# Patient Record
Sex: Female | Born: 1965 | Race: White | Hispanic: No | Marital: Single | Smoking: Current every day smoker
Health system: Southern US, Community
[De-identification: ages and names within clinical notes are randomized; demographics above are authoritative.]

## PROBLEM LIST (undated history)

## (undated) DIAGNOSIS — N200 Calculus of kidney: Secondary | ICD-10-CM

## (undated) DIAGNOSIS — F419 Anxiety disorder, unspecified: Secondary | ICD-10-CM

## (undated) DIAGNOSIS — F1020 Alcohol dependence, uncomplicated: Secondary | ICD-10-CM

## (undated) HISTORY — PX: LEG SURGERY: SHX1003

## (undated) HISTORY — PX: LITHOTRIPSY: SUR834

---

## 2007-07-16 ENCOUNTER — Ambulatory Visit: Payer: Self-pay | Admitting: Nurse Practitioner

## 2007-09-23 ENCOUNTER — Emergency Department: Payer: Self-pay | Admitting: Unknown Physician Specialty

## 2013-03-04 ENCOUNTER — Encounter: Payer: Self-pay | Admitting: Rehabilitation

## 2013-03-09 ENCOUNTER — Encounter: Payer: Self-pay | Admitting: Rehabilitation

## 2013-03-23 ENCOUNTER — Ambulatory Visit: Payer: Self-pay | Admitting: Family Medicine

## 2015-02-10 IMAGING — CT CT CHEST W/ CM
1 series · 15 of 33 positions shown, 19 images · non-contrast
Comparison: none

REASON FOR EXAM: labs 1st abnormal chest XR   eval for nodule
COMMENTS:

[Series 2: soft tissue · axial · 0.68mm/px · z∈[-569,-287]mm · 15 of 112 slices shown, 19 images]
[im 9/112  mediastinal]
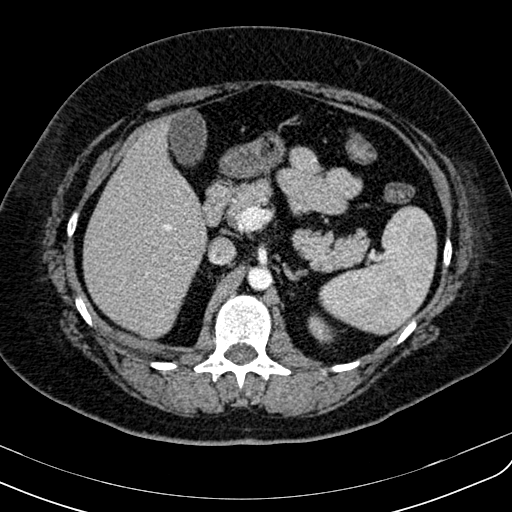
[im 9/112  lung]
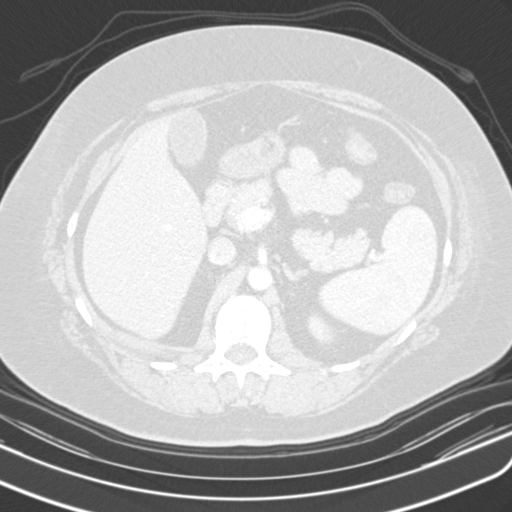
[im 17/112  lung]
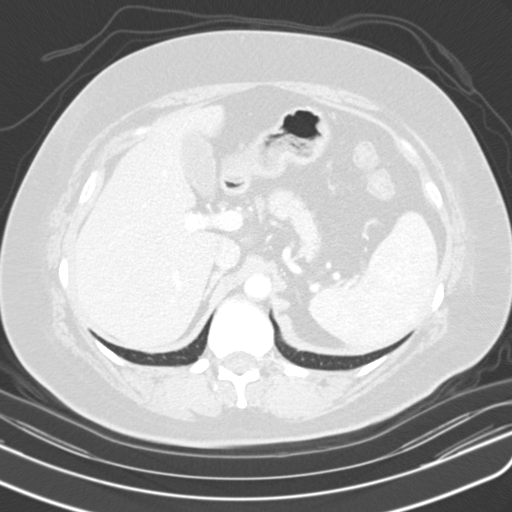
[im 23/112  lung]
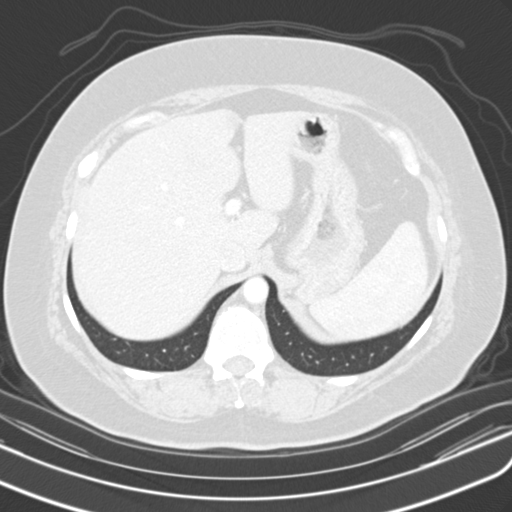
[im 29/112  lung]
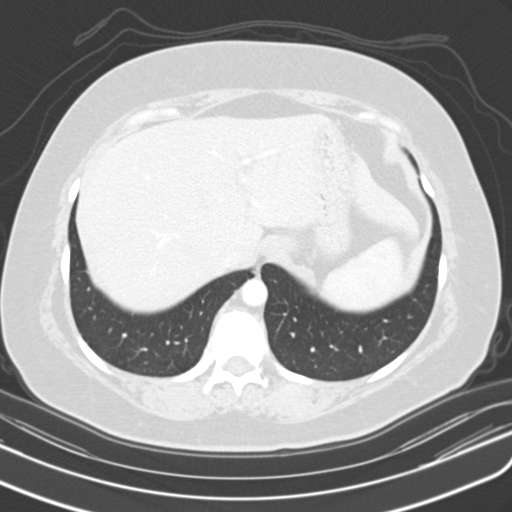
[im 38/112  mediastinal]
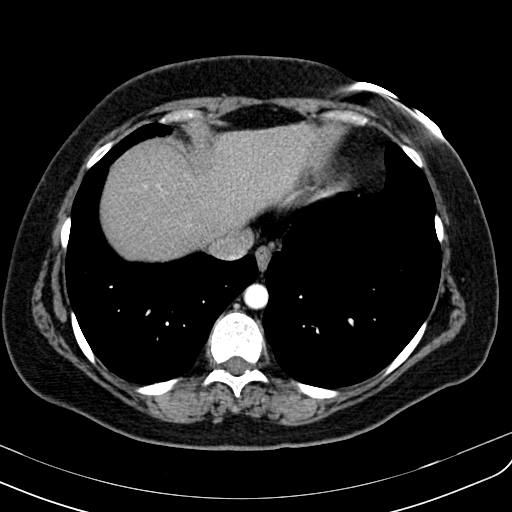
[im 38/112  lung]
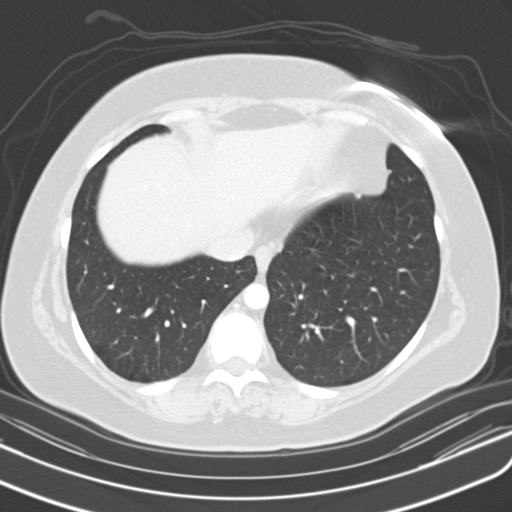
[im 45/112  lung]
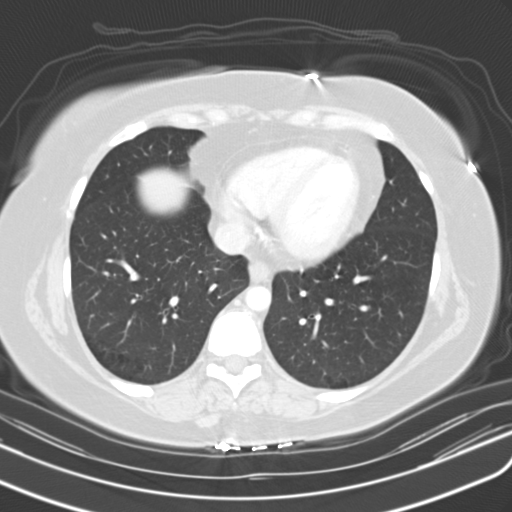
[im 50/112  lung]
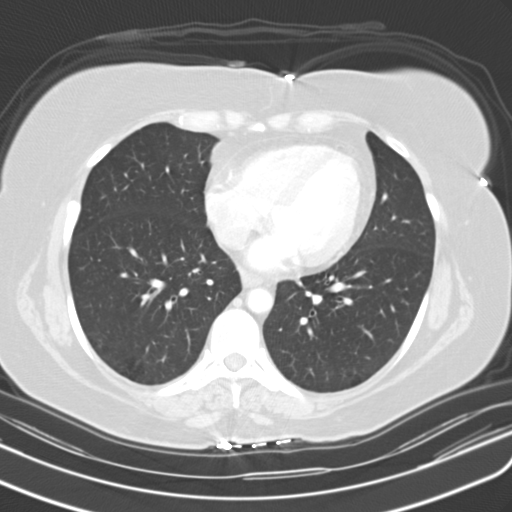
[im 58/112  lung]
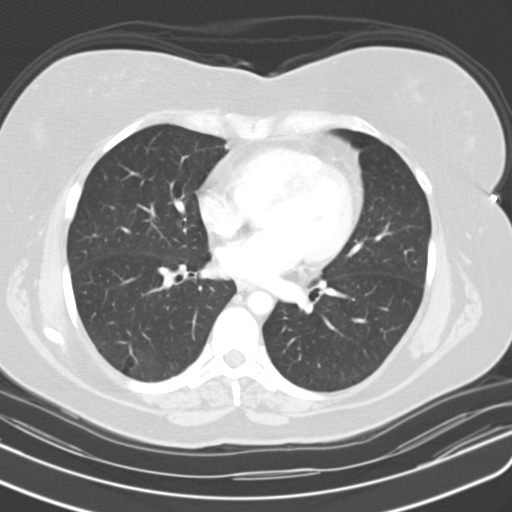
[im 62/112  mediastinal]
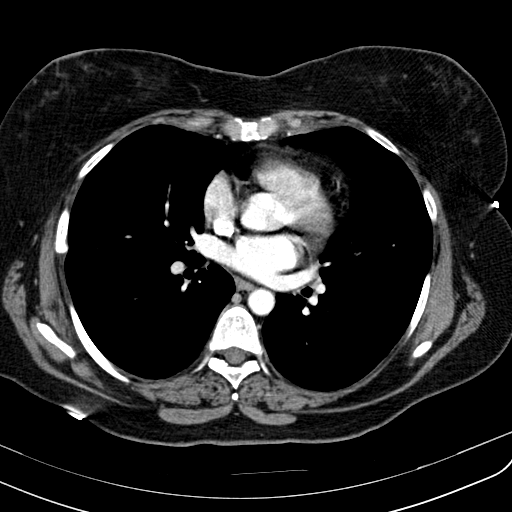
[im 62/112  lung]
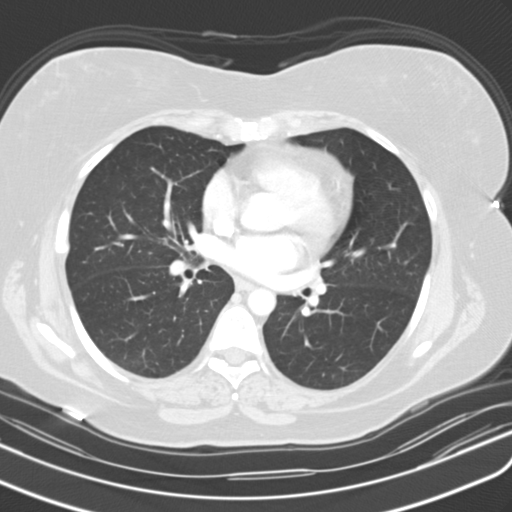
[im 67/112  lung]
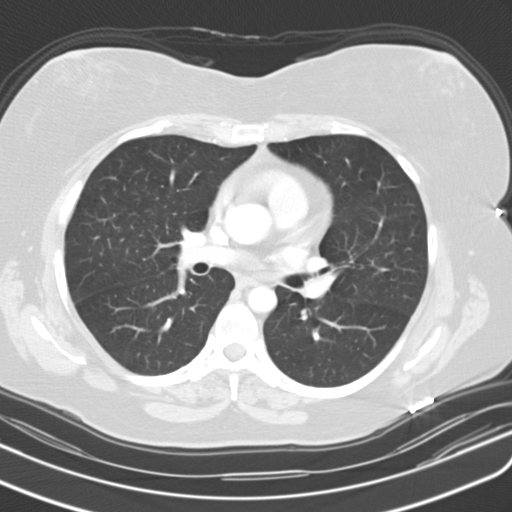
[im 75/112  lung]
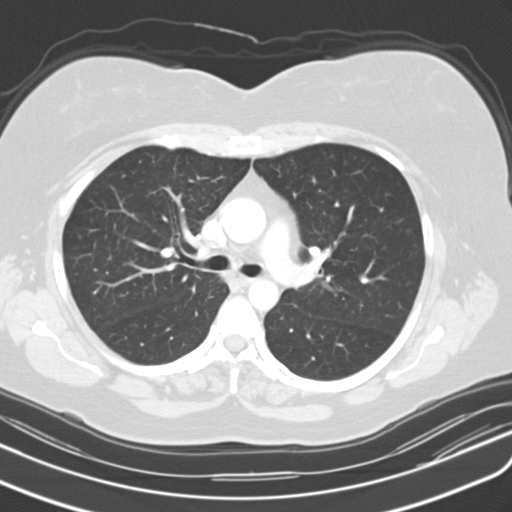
[im 83/112  lung]
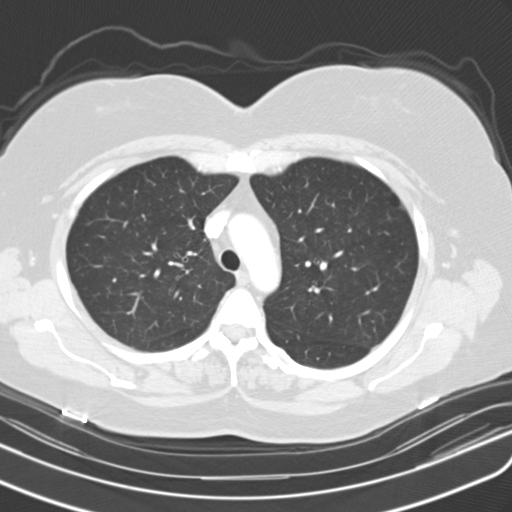
[im 89/112  mediastinal]
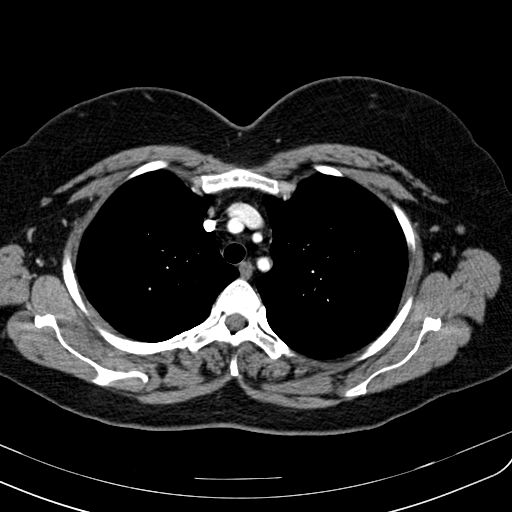
[im 89/112  lung]
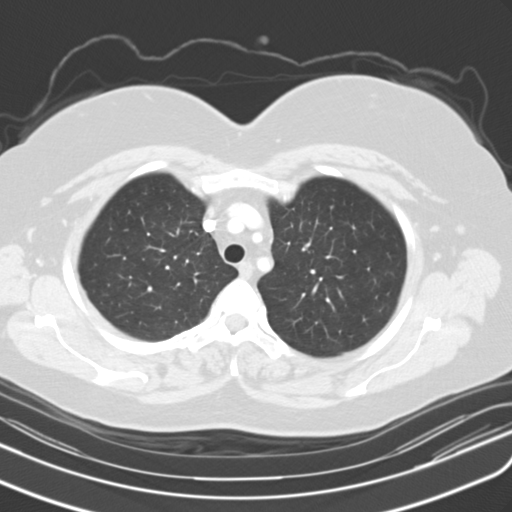
[im 95/112  lung]
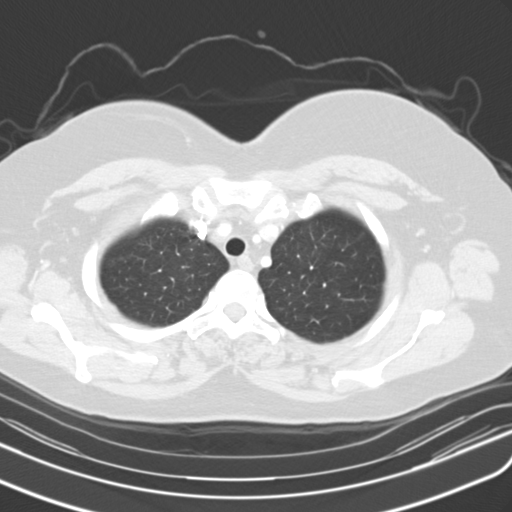
[im 103/112  lung]
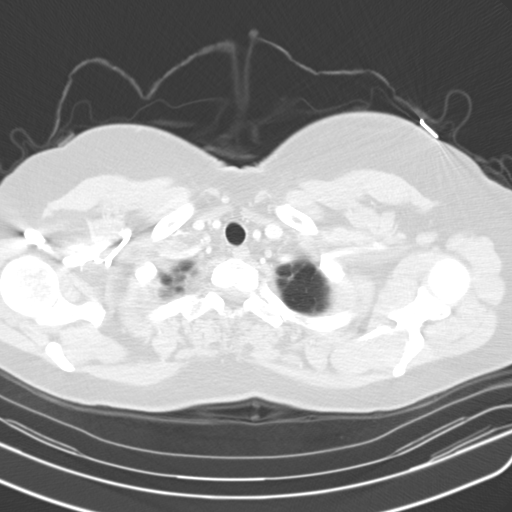

[15 of 33 positions shown; findings below may reference images not displayed]

PROCEDURE:     ACDIEL - ACDIEL CHEST WITH CONTRAST  - March 23, 2013 [DATE]

RESULT:     Chest CT is performed to 75 mL of [id number] iodinated
intravenous contrast with images reconstructed at 3.0 mm slice thickness in
the axial plane. There is no previous exam for comparison.

Lung window images demonstrate a pleural based density in the right lower
lobe region without calcification on image 58 measuring approximately 8.6 mm
across the base. The lungs are otherwise clear. There is no pleural or
pericardial effusion area no radiopaque gallstones are evident. The included
upper abdominal viscera otherwise appear to be unremarkable. The thoracic
aorta is normal in caliber. There is no mediastinal or hilar mass or
adenopathy. There is no axillary mass or adenopathy. The bony structures
appear within normal limits.
IMPRESSION: 1. Pleural based soft tissue density in the right lower lobe or posterior
paraspinal region as described. This is noncalcified and smoothly
marginated. Noncontrast CT followup in 6 months is recommended.

[REDACTED]

## 2015-08-10 ENCOUNTER — Encounter: Payer: Self-pay | Admitting: *Deleted

## 2015-08-10 ENCOUNTER — Emergency Department
Admission: EM | Admit: 2015-08-10 | Discharge: 2015-08-11 | Disposition: A | Discharge status: Rehabilitation Facility | Payer: 59 | Attending: Emergency Medicine | Admitting: Emergency Medicine

## 2015-08-10 DIAGNOSIS — Y939 Activity, unspecified: Secondary | ICD-10-CM | POA: Insufficient documentation

## 2015-08-10 DIAGNOSIS — X58XXXA Exposure to other specified factors, initial encounter: Secondary | ICD-10-CM | POA: Diagnosis not present

## 2015-08-10 DIAGNOSIS — Y929 Unspecified place or not applicable: Secondary | ICD-10-CM | POA: Insufficient documentation

## 2015-08-10 DIAGNOSIS — R45851 Suicidal ideations: Secondary | ICD-10-CM | POA: Diagnosis present

## 2015-08-10 DIAGNOSIS — F10220 Alcohol dependence with intoxication, uncomplicated: Secondary | ICD-10-CM

## 2015-08-10 DIAGNOSIS — S0012XA Contusion of left eyelid and periocular area, initial encounter: Secondary | ICD-10-CM | POA: Insufficient documentation

## 2015-08-10 DIAGNOSIS — Y999 Unspecified external cause status: Secondary | ICD-10-CM | POA: Diagnosis not present

## 2015-08-10 HISTORY — DX: Calculus of kidney: N20.0

## 2015-08-10 HISTORY — DX: Alcohol dependence, uncomplicated: F10.20

## 2015-08-10 LAB — COMPREHENSIVE METABOLIC PANEL
ALBUMIN: 4.1 g/dL (ref 3.5–5.0)
ALT: 49 U/L (ref 14–54)
AST: 79 U/L — AB (ref 15–41)
Alkaline Phosphatase: 107 U/L (ref 38–126)
Anion gap: 17 — ABNORMAL HIGH (ref 5–15)
BUN: 6 mg/dL (ref 6–20)
CHLORIDE: 98 mmol/L — AB (ref 101–111)
CO2: 25 mmol/L (ref 22–32)
Calcium: 8.3 mg/dL — ABNORMAL LOW (ref 8.9–10.3)
Creatinine, Ser: 0.86 mg/dL (ref 0.44–1.00)
GFR calc Af Amer: >60 mL/min (ref >60)
GFR calc non Af Amer: >60 mL/min (ref >60)
GLUCOSE: 84 mg/dL (ref 65–99)
POTASSIUM: 3.4 mmol/L — AB (ref 3.5–5.1)
SODIUM: 140 mmol/L (ref 135–145)
Total Bilirubin: 0.8 mg/dL (ref 0.3–1.2)
Total Protein: 7.7 g/dL (ref 6.5–8.1)

## 2015-08-10 LAB — CBC
HCT: 47.2 % — ABNORMAL HIGH (ref 35.0–47.0)
HEMOGLOBIN: 15.6 g/dL (ref 12.0–16.0)
MCH: 30 pg (ref 26.0–34.0)
MCHC: 33.1 g/dL (ref 32.0–36.0)
MCV: 90.6 fL (ref 80.0–100.0)
PLATELETS: 292 10*3/uL (ref 150–440)
RBC: 5.21 MIL/uL — AB (ref 3.80–5.20)
RDW: 14.3 % (ref 11.5–14.5)
WBC: 7.4 10*3/uL (ref 3.6–11.0)

## 2015-08-10 LAB — URINE DRUG SCREEN, QUALITATIVE (ARMC ONLY)
AMPHETAMINES, UR SCREEN: NOT DETECTED
Barbiturates, Ur Screen: NOT DETECTED
Benzodiazepine, Ur Scrn: NOT DETECTED
Cannabinoid 50 Ng, Ur ~~LOC~~: NOT DETECTED
Cocaine Metabolite,Ur ~~LOC~~: NOT DETECTED
MDMA (ECSTASY) UR SCREEN: NOT DETECTED
Methadone Scn, Ur: NOT DETECTED
Opiate, Ur Screen: NOT DETECTED
PHENCYCLIDINE (PCP) UR S: NOT DETECTED
Tricyclic, Ur Screen: NOT DETECTED

## 2015-08-10 LAB — ACETAMINOPHEN LEVEL: Acetaminophen (Tylenol), Serum: <10 ug/mL — ABNORMAL LOW (ref 10–30)

## 2015-08-10 LAB — ETHANOL: ALCOHOL ETHYL (B): 299 mg/dL — AB (ref <5)

## 2015-08-10 LAB — SALICYLATE LEVEL: Salicylate Lvl: <4 mg/dL (ref 2.8–30.0)

## 2015-08-10 MED ORDER — THIAMINE HCL 100 MG/ML IJ SOLN: 100.0000 mg | Freq: Every day | INTRAMUSCULAR | Status: DC

## 2015-08-10 MED ORDER — LORAZEPAM 2 MG/ML IJ SOLN: 0.0000 mg | Freq: Two times a day (BID) | INTRAMUSCULAR | Status: DC

## 2015-08-10 MED ORDER — LORAZEPAM 2 MG/ML IJ SOLN: 0.0000 mg | Freq: Four times a day (QID) | INTRAMUSCULAR | Status: DC

## 2015-08-10 MED ORDER — VITAMIN B-1 100 MG PO TABS
100.0000 mg | ORAL_TABLET | Freq: Every day | ORAL | Status: DC
  Administered 2015-08-10: 100 mg via ORAL
  Filled 2015-08-10: qty 1

## 2015-08-10 MED ORDER — LORAZEPAM 2 MG PO TABS
0.0000 mg | ORAL_TABLET | Freq: Four times a day (QID) | ORAL | Status: DC
  Administered 2015-08-11: 2 mg via ORAL
  Filled 2015-08-10 (×2): qty 1

## 2015-08-10 MED ORDER — LORAZEPAM 2 MG PO TABS
0.0000 mg | ORAL_TABLET | Freq: Two times a day (BID) | ORAL | Status: DC
Start: 2015-08-10 — End: 2015-08-11
  Administered 2015-08-10: 1 mg via ORAL

## 2015-08-10 NOTE — ED Notes (Signed)
BEHAVIORAL HEALTH ROUNDING Patient sleeping: No. Patient alert and oriented: yes Behavior appropriate: Yes.  ; If no, describe: n/a Nutrition and fluids offered: Yes  Toileting and hygiene offered: Yes  Sitter present: yes Law enforcement present: Yes   ENVIRONMENTAL ASSESSMENT Potentially harmful objects out of patient reach: Yes.   Personal belongings secured: Yes.   Patient dressed in hospital provided attire only: Yes.   Plastic bags out of patient reach: Yes.   Patient care equipment (cords, cables, call bells, lines, and drains) shortened, removed, or accounted for: Yes.   Equipment and supplies removed from bottom of stretcher: Yes.   Potentially toxic materials out of patient reach: Yes.   Sharps container removed or out of patient reach: Yes.   

## 2015-08-10 NOTE — ED Provider Notes (Signed)
[facility] Emergency Department Provider Note  ____________________________________________  Time seen: 4:15 PM  I have reviewed the triage vital signs and the nursing notes.   HISTORY  Chief Complaint Alcohol Problem and Psychiatric Evaluation    HPI Allison Fowler is a 49 y.o. female report suicidal ideation for about 3 days. No specific plan. No history of suicide attempt. She states that she's been feeling very down recently because of her chronic alcohol abuse. She drinks 1-4 pints of liquor daily. She reports a history of seizure if she hasn't had anything to drink for 2-3 days. She last drank today at 2:00 PM. No acute complaints.     Past Medical History  Diagnosis Date  . Alcoholism   . Kidney stone      There are no active problems to display for this patient.    Past Surgical History  Procedure Laterality Date  . Lithotripsy       No current outpatient prescriptions on file.   Allergies Review of patient's allergies indicates no known allergies.   No family history on file.  Social History Social History  Substance Use Topics  . Smoking status: Never Smoker   . Smokeless tobacco: None  . Alcohol Use: Yes    Review of Systems  Constitutional:   No fever or chills. No weight changes Eyes:   No blurry vision or double vision. Had bruising around Left eye, unsure when/how it happened ENT:   No sore throat. Cardiovascular:   No chest pain. Respiratory:   No dyspnea or cough. Gastrointestinal:   Negative for abdominal pain, vomiting and diarrhea.  No BRBPR or melena. Genitourinary:   Negative for dysuria, urinary retention, bloody urine, or difficulty urinating. Musculoskeletal:   Negative for back pain. No joint swelling or pain. Skin:   Negative for rash. Neurological:   Mild diffuse headache. No vision changes.. No paresthesia or weakness Psychiatric:  No anxiety or depression.   Endocrine:  No hot/cold  intolerance, changes in energy, or sleep difficulty.  10-point ROS otherwise negative.  ____________________________________________   PHYSICAL EXAM:  VITAL SIGNS: ED Triage Vitals  Enc Vitals Group     BP 08/10/15 1552 131/88 mmHg     Pulse Rate 08/10/15 1552 111     Resp 08/10/15 1552 16     Temp 08/10/15 1552 98.4 F (36.9 C)     Temp src --      SpO2 08/10/15 1552 96 %     Weight 08/10/15 1552 185 lb (83.915 kg)     Height 08/10/15 1552  (1.651 m)     Head Cir --      Peak Flow --      Pain Score 08/10/15 1555 10     Pain Loc --      Pain Edu? --      Excl. in GC? --      Constitutional:   Alert and oriented. Well appearing and in no distress. Eyes:   No scleral icterus. No conjunctival pallor. PERRL. EOMI and painless. There is a small amount of ecchymosis on the upper eyelid without edema or swelling or induration. No lacerations or breaks in the skin. ENT   Head:   Normocephalic and atraumatic. No bony tenderness. No evidence of trauma except for the upper eyelid ecchymosis without any apparent focus of trauma   Nose:   No congestion/rhinnorhea. No septal hematoma   Mouth/Throat:   MMM, no pharyngeal erythema. No peritonsillar mass. No uvula shift.  Neck:   No stridor. No SubQ emphysema. No meningismus. Hematological/Lymphatic/Immunilogical:   No cervical lymphadenopathy. Cardiovascular:   RRR. Normal and symmetric distal pulses are present in all extremities. No murmurs, rubs, or gallops. Respiratory:   Normal respiratory effort without tachypnea nor retractions. Breath sounds are clear and equal bilaterally. No wheezes/rales/rhonchi. Gastrointestinal:   Soft and nontender. No distention. There is no CVA tenderness.  No rebound, rigidity, or guarding. Genitourinary:   deferred Musculoskeletal:   Nontender with normal range of motion in all extremities. No joint effusions.  No lower extremity tenderness.  No edema. Neurologic:   Normal speech and  language.  CN 2-10 normal. Motor grossly intact. No pronator drift.  Normal gait. No gross focal neurologic deficits are appreciated.  Skin:    Skin is warm, dry and intact. No rash noted.  No petechiae, purpura, or bullae. Psychiatric:   Mood and affect are normal. Speech and behavior are normal. Patient exhibits appropriate insight and judgment.  ____________________________________________    LABS (pertinent positives/negatives) (all labs ordered are listed, but only abnormal results are displayed) Labs Reviewed  COMPREHENSIVE METABOLIC PANEL  ETHANOL  SALICYLATE LEVEL  ACETAMINOPHEN LEVEL  CBC  URINE RAPID DRUG SCREEN, HOSP PERFORMED   ____________________________________________   EKG    ____________________________________________    RADIOLOGY    ____________________________________________   PROCEDURES   ____________________________________________   INITIAL IMPRESSION / ASSESSMENT AND PLAN / ED COURSE  Pertinent labs & imaging results that were available during my care of the patient were reviewed by me and considered in my medical decision making (see chart for details).  Patient presents requesting alcohol detox. She has suicidal ideation but no plan and does not appear to be an imminent threat to herself or others. Not amenable at this time. We will continue to observe her in the emergency department pending labs and TTS evaluation for coordination of further detox services. No evidence of severe intoxication or withdrawal at this time. She appears to be clinically sober.     ____________________________________________   FINAL CLINICAL IMPRESSION(S) / ED DIAGNOSES  Final diagnoses:  Alcohol dependence with uncomplicated intoxication  Suicidal ideation      Sharman Cheek, MD 08/10/15 [phone]

## 2015-08-10 NOTE — ED Notes (Signed)
Pt states she needs help with her alcohol problem. Reports she drinks a pint to half gallon a day of liquor, last drink around 1400 today. Pt states SI for several days without a specific plan.

## 2015-08-10 NOTE — BHH Counselor (Addendum)
Placed phone call to [facility] to refer patient for residential substance abuse services.  Pt has private insurance with [place].  [facility] does not accept private insurance.  Faxed referral packet to [facility] and [place].

## 2015-08-10 NOTE — ED Notes (Signed)
BEHAVIORAL HEALTH ROUNDING Patient sleeping: Yes.   Patient alert and oriented: not applicable Behavior appropriate: Yes.  ; If no, describe:  Nutrition and fluids offered: Yes  Toileting and hygiene offered: Yes  Sitter present: yes Law enforcement present: Yes  

## 2015-08-10 NOTE — BH Assessment (Signed)
Assessment Note  Allison Fowler is an 49 y.o. female.Pt reports daily "nonstop" alcohol use and is requesting detox. Pt reports hx of  fleeting SI attributed to guilt from alcohol use.  Pt denies current SI, intent and plan. Pt reports decrease in self-care attributed to alcohol use.  Pt. reports previous inpatient tx for alcohol followed by 6-7 year abstinence period. Pt unable to identify relapse triggers. Pt. endorses the following withdrawal sxs: irritability, tachycardia, fevers/chills, DT's, Tingling, Nausea, Diarrhea, Seizures, Tremors, Sweats.  Pt. identifies caring for grandmother as current stressor. Pt. reports persecutory delusions with intoxication. Pt reports no HI, self-injurious behaviors or history of violence.   Pt. To be referred to [facility] services per. Dr. Scotty Court.   Axis I: See current hospital problem list  Past Medical History:  Past Medical History  Diagnosis Date  . Alcoholism   . Kidney stone     Past Surgical History  Procedure Laterality Date  . Lithotripsy      Family History: No family history on file.  Social History:  reports that she has never smoked. She does not have any smokeless tobacco history on file. She reports that she drinks alcohol. She reports that she does not use illicit drugs.  Additional Social History:  Alcohol / Drug Use Pain Medications: None Reported Prescriptions: None Reported Over the Counter: None Reported History of alcohol / drug use?: Yes Longest period of sobriety (when/how long): 6-7 yrs Negative Consequences of Use:  (None Reported) Withdrawal Symptoms: Agitation, Diarrhea, Sweats, DTs, Irritability, Tremors, Tingling, Nausea / Vomiting, Tachycardia Substance #1 Name of Substance 1: Alcohol 1 - Age of First Use: Pt unable to remember 1 - Amount (size/oz): unable to qunatify "non-stop"  1 - Frequency: daily 1 - Duration: None Reported 1 - Last Use / Amount: 08/10/15  CIWA: CIWA-Ar BP: 118/77 mmHg Pulse Rate: (!)  109 Nausea and Vomiting: mild nausea with no vomiting Tactile Disturbances: none Tremor: two Auditory Disturbances: not present Paroxysmal Sweats: no sweat visible Visual Disturbances: not present Anxiety: moderately anxious, or guarded, so anxiety is inferred Headache, Fullness in Head: none present Agitation: somewhat more than normal activity Orientation and Clouding of Sensorium: oriented and can do serial additions CIWA-Ar Total: 8 COWS:    Allergies: No Known Allergies  Home Medications:  (Not in a hospital admission)  OB/GYN Status:  Patient's last menstrual period was 07/31/2015.  General Assessment Data Location of Assessment: [facility] ED TTS Assessment: In system Is this a Tele or Face-to-Face Assessment?: Face-to-Face Is this an Initial Assessment or a Re-assessment for this encounter?: Initial Assessment Marital status: Single Maiden name: N/A Is patient pregnant?: No Pregnancy Status: No Living Arrangements: Other relatives Database administrator) Can pt return to current living arrangement?: Yes Admission Status: Voluntary Is patient capable of signing voluntary admission?: Yes Referral Source: Self/Family/Friend Insurance type: None  Medical Screening Exam [facility] ONLY) Medical Exam completed: Yes  Crisis Care Plan Living Arrangements: Other relatives Database administrator) Name of Psychiatrist: None Reported Name of Therapist: None Reported  Education Status Is patient currently in school?: No Current Grade: N/A Highest grade of school patient has completed: 12th Name of school: N/A Contact person: None Reported  Risk to self with the past 6 months Suicidal Ideation: No-Not Currently/Within Last 6 Months Has patient been a risk to self within the past 6 months prior to admission? : No Suicidal Intent: No Has patient had any suicidal intent within the past 6 months prior to admission? : No Is patient at risk for  suicide?: No Suicidal Plan?: No Has patient had  any suicidal plan within the past 6 months prior to admission? 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 : No Thoughts of Harm to Others: No Current Homicidal Intent: No Current Homicidal Plan: No Access to Homicidal Means: No Identified Victim: N/A History of harm to others?: No Assessment of Violence: None Noted Violent Behavior Description: N/A Does patient have access to weapons?: No Criminal Charges Pending?: No Does patient have a court date: No Is patient on probation?: No  Psychosis Hallucinations: None noted Delusions: Persecutory  Mental Status Report Appearance/Hygiene: Disheveled, In scrubs Eye Contact: Good Motor Activity: Agitation Speech: Soft Level of Consciousness: Alert, Quiet/awake Mood: Anxious Affect: Anxious Anxiety Level: Moderate Thought Processes: Coherent, Relevant Judgement: Unimpaired Orientation: Person, Place, Time, Situation Obsessive Compulsive Thoughts/Behaviors: None  Cognitive Functioning Concentration: Good Memory: Recent Intact, Remote Intact IQ: Average Insight: Good Impulse Control: Fair Appetite: Poor Weight Loss: 0  (None Reported) Weight Gain: 0 (None Reported) Sleep: No Change Total Hours of Sleep:  (Not Reported) Vegetative Symptoms: Not bathing, Decreased grooming  ADLScreening [facility] Assessment Services) Patient's cognitive ability adequate to safely complete daily activities?: Yes Patient able to express need for assistance with ADLs?: Yes Independently performs ADLs?: Yes (appropriate for developmental age)  Prior Inpatient Therapy Prior Inpatient Therapy: Yes Prior Therapy Dates:  ("10 years ago") Prior Therapy Facilty/Provider(s): [place], [place] Reason for Treatment: Alcohol Use  Prior Outpatient Therapy Prior Outpatient Therapy: No Prior Therapy Dates: N/A Prior Therapy Facilty/Provider(s): N/A Reason for Treatment: N/A Does patient have an ACCT team?: No Does patient have Intensive In-House Services?  : No Does patient have [facility] services? : No Does patient have P4CC services?: No  ADL Screening (condition at time of admission) Patient's cognitive ability adequate to safely complete daily activities?: Yes Is the patient deaf or have difficulty hearing?: No Does the patient have difficulty seeing, even when wearing glasses/contacts?: No Does the patient have difficulty concentrating, remembering, or making decisions?: Yes Patient able to express need for assistance with ADLs?: Yes Does the patient have difficulty dressing or bathing?: No Independently performs ADLs?: Yes (appropriate for developmental age) Does the patient have difficulty walking or climbing stairs?: No Weakness of Legs: None Weakness of Arms/Hands: None  Home Assistive Devices/Equipment Home Assistive Devices/Equipment: None  Therapy Consults (therapy consults require a physician order) PT Evaluation Needed: No OT Evalulation Needed: No SLP Evaluation Needed: No Abuse/Neglect Assessment (Assessment to be complete while patient is alone) Physical Abuse: Denies Verbal Abuse: Denies Sexual Abuse:  Denies Exploitation of patient/patient's resources: Denies Self-Neglect: Denies Values / Beliefs Cultural Requests During Hospitalization: None Spiritual Requests During Hospitalization: None Consults Spiritual Care Consult Needed: No Social Work Consult Needed: No Merchant navy officer (For Healthcare) Does patient have an advance directive?: No Would patient like information on creating  an advanced directive?: No - patient declined information    Additional Information 1:1 In Past 12 Months?: No CIRT Risk: No Elopement Risk: No Does patient have medical clearance?: No     Disposition:  Disposition Initial Assessment Completed for this Encounter: Yes Disposition of Patient: Referred to ([facility])  On Site Evaluation by:   Reviewed with Physician:    Allison Fowler 08/10/2015 7:45 PM

## 2015-08-10 NOTE — ED Notes (Addendum)
BEHAVIORAL HEALTH ROUNDING Patient sleeping: No. Patient alert and oriented: yes Behavior appropriate: Yes.  ; If no, describe:  Nutrition and fluids offered: Yes  Toileting and hygiene offered: Yes  Sitter present: yes Law enforcement present: Yes  

## 2015-08-10 NOTE — ED Notes (Signed)

## 2015-08-10 NOTE — ED Notes (Signed)
BEHAVIORAL HEALTH ROUNDING Patient sleeping: No. Patient alert and oriented: yes Behavior appropriate: Yes.  ; If no, describe:  Nutrition and fluids offered: Yes  Toileting and hygiene offered: Yes  Sitter present: yes Law enforcement present: Yes  ED BHU PLACEMENT JUSTIFICATION Is the patient under IVC or is there intent for IVC: No. Is the patient medically cleared: Yes.   Is there vacancy in the ED BHU: Yes.   Is the population mix appropriate for patient: Yes.   Is the patient awaiting placement in inpatient or outpatient setting: Yes.   Has the patient had a psychiatric consult: Yes.   Survey of unit performed for contraband, proper placement and condition of furniture, tampering with fixtures in bathroom, shower, and each patient room: Yes.  ; Findings: none APPEARANCE/BEHAVIOR calm, cooperative and adequate rapport can be established NEURO ASSESSMENT Orientation: time, place and person Hallucinations: No.None noted (Hallucinations) Speech: Normal Gait: normal RESPIRATORY ASSESSMENT Normal expansion.  Clear to auscultation.  No rales, rhonchi, or wheezing. CARDIOVASCULAR ASSESSMENT regular rate and rhythm, S1, S2 normal, no murmur, click, rub or gallop GASTROINTESTINAL ASSESSMENT soft, nontender, BS WNL, no r/g EXTREMITIES normal strength, tone, and muscle mass, no deformities, no erythema, induration, or nodules, no evidence of joint effusion, ROM of all joints is normal, no evidence of joint instability PLAN OF CARE Provide calm/safe environment. Vital signs assessed twice daily. ED BHU Assessment once each 12-hour shift. Collaborate with intake RN daily or as condition indicates. Assure the ED provider has rounded once each shift. Provide and encourage hygiene. Provide redirection as needed. Assess for escalating behavior; address immediately and inform ED provider.  Assess family dynamic and appropriateness for visitation as needed: Yes.  ; If necessary, describe findings:    Educate the patient/family about BHU procedures/visitation: Yes.  ; If necessary, describe findings:  ENVIRONMENTAL ASSESSMENT Potentially harmful objects out of patient reach: Yes.   Personal belongings secured: Yes.   Patient dressed in hospital provided attire only: Yes.   Plastic bags out of patient reach: Yes.   Patient care equipment (cords, cables, call bells, lines, and drains) shortened, removed, or accounted for: Yes.   Equipment and supplies removed from bottom of stretcher: Yes.   Potentially toxic materials out of patient reach: Yes.   Sharps container removed or out of patient reach: Yes.   

## 2015-08-11 NOTE — ED Provider Notes (Signed)
-----------------------------------------   4:55 AM on 08/11/2015 -----------------------------------------   Blood pressure 120/69, pulse 114, temperature 98.1 F (36.7 C), temperature source Oral, resp. rate 18, height  (1.651 m), weight 185 lb (83.915 kg), last menstrual period 07/31/2015, SpO2 98 %.  No acute events. I've seen and examined this patient personally. Patient is calm and cooperative, plan is to discharge to [facility] in the morning.  Minna Antis, MD 08/11/15 [phone]

## 2015-08-11 NOTE — ED Notes (Signed)
BEHAVIORAL HEALTH ROUNDING Patient sleeping: Yes.   Patient alert and oriented: yes Behavior appropriate: Yes.  ; If no, describe:  Nutrition and fluids offered: No Toileting and hygiene offered: No Sitter present: not applicable Law enforcement present: Yes  

## 2015-08-11 NOTE — BHH Counselor (Addendum)
Received call from [facility] with [facility] reporting that patient has been accepted for admission.  Admitting MD is Dr. Shawnie Dapper.  Call report after 8:30 am [phone].  Call [facility] to arrange for transportation [phone].

## 2015-08-11 NOTE — ED Provider Notes (Signed)
Filed Vitals:   08/11/15 0746  BP: 127/93  Pulse: 106  Temp: 98.4 F (36.9 C)  Resp: 18   I evaluated the patient notified her of the plan to transfer to [facility]. She is understanding of this. She had a couple questions, I answered. She is awake alert in no distress. She does have some mild tachycardia, I suspect this is likely due being slightly anxious and also minimal withdrawal symptoms at this time.  Transferring to [facility].  physician Dr. Shawnie Dapper.  Sharyn Creamer, MD 08/11/15 [phone]

## 2015-08-11 NOTE — ED Notes (Signed)
BEHAVIORAL HEALTH ROUNDING Patient sleeping: Yes.   Patient alert and oriented: sleeping Behavior appropriate: Yes.  ; If no, describe:  Nutrition and fluids offered: sleeping Toileting and hygiene offered: sleeping Sitter present: not applicable Law enforcement present: Yes  

## 2015-08-11 NOTE — BHH Counselor (Signed)
Spoke with the patient about being accepted to [facility] and being transported by [facility]. She was informed she would have to find transportation back, when she is discharged from their facility. She verbalized she understood and will find a way back home.

## 2015-11-21 ENCOUNTER — Ambulatory Visit
Admission: EM | Admit: 2015-11-21 | Discharge: 2015-11-21 | Disposition: A | Discharge status: Home / Self Care | Payer: 59 | Attending: Family Medicine | Admitting: Family Medicine

## 2015-11-21 ENCOUNTER — Ambulatory Visit (INDEPENDENT_AMBULATORY_CARE_PROVIDER_SITE_OTHER): Payer: 59

## 2015-11-21 DIAGNOSIS — J069 Acute upper respiratory infection, unspecified: Secondary | ICD-10-CM

## 2015-11-21 MED ORDER — HYDROCOD POLST-CPM POLST ER 10-8 MG/5ML PO SUER: 5.0000 mL | Freq: Two times a day (BID) | ORAL | Status: DC

## 2015-11-21 MED ORDER — BENZONATATE 200 MG PO CAPS
200.0000 mg | ORAL_CAPSULE | Freq: Three times a day (TID) | ORAL | Status: DC | PRN
Start: 2015-11-21 — End: 2016-01-05

## 2015-11-21 MED ORDER — AZITHROMYCIN 250 MG PO TABS: ORAL_TABLET | ORAL | Status: DC

## 2015-11-21 NOTE — ED Notes (Signed)
States started last Thursday with runny nose, cough, sore throat. Symptoms not improving. States has "a lot of pressure in my head". Non productive cough

## 2015-11-21 NOTE — ED Provider Notes (Signed)
CSN: [id number]     Arrival date & time 11/21/15  1119 History   First MD Initiated Contact with Patient 11/21/15 1241     Chief Complaint  Patient presents with  . URI   (Consider location/radiation/quality/duration/timing/severity/associated sxs/prior Treatment) HPI   49 year old female who is accompanied by her mother with a 5 day history of sore throat initially that then began to settle in her chest with congestion and non-productive cough. States she is coughing all night and keeping other people awake in her household. Having pressure in her head with no is production of any color. Have a fever over the weekend of 100F afebrile today. She is a smoker looking greater than 1 pack of cigarettes per day in the past but now down to half a pack. O2 sats today on room air 96%. Respirations a 17      Past Medical History  Diagnosis Date  . Alcoholism (HCC)   . Kidney stone    Past Surgical History  Procedure Laterality Date  . Lithotripsy     Family History  Problem Relation Age of Onset  . Cancer Father    Social History  Substance Use Topics  . Smoking status: Current Every Day Smoker -- 0.50 packs/day    Types: Cigarettes  . Smokeless tobacco: None  . Alcohol Use: No   OB History    No data available     Review of Systems  Constitutional: Positive for fever, chills and fatigue. Negative for activity change and appetite change.  HENT: Positive for congestion, postnasal drip, rhinorrhea, sinus pressure and sore throat.   Respiratory: Positive for cough.   All other systems reviewed and are negative.   Allergies  Review of patient's allergies indicates no known allergies.  Home Medications   Prior to Admission medications   Medication Sig Start Date End Date Taking? Authorizing Provider  gabapentin (NEURONTIN) 100 MG capsule Take 100 mg by mouth 3 (three) times daily.   Yes Historical Provider, MD  azithromycin (ZITHROMAX Z-PAK) 250 MG tablet Use as per package  instructions 11/21/15   Lutricia FeilWilliam P Sante Biedermann, PA-C  benzonatate (TESSALON) 200 MG capsule Take 1 capsule (200 mg total) by mouth 3 (three) times daily as needed for cough. 11/21/15   Lutricia FeilWilliam P Marina Boerner, PA-C  chlorpheniramine-HYDROcodone (TUSSIONEX PENNKINETIC ER) 10-8 MG/5ML SUER Take 5 mLs by mouth 2 (two) times daily. 11/21/15   Lutricia FeilWilliam P Karmin Kasprzak, PA-C  CITALOPRAM HYDROBROMIDE PO Take by mouth.    Historical Provider, MD   Meds Ordered and Administered this Visit  Medications - No data to display  BP 129/62 mmHg  Pulse 100  Temp(Src) 98.7 F (37.1 C) (Tympanic)  Resp 17  Ht 5\' 5"  (1.651 m)  Wt 180 lb (81.647 kg)  BMI 29.95 kg/m2  SpO2 96%  LMP 11/07/2015 (Approximate) No data found.   Physical Exam  Constitutional: She is oriented to person, place, and time. She appears well-developed and well-nourished. No distress.  HENT:  Head: Normocephalic and atraumatic.  Right Ear: External ear normal.  Left Ear: External ear normal.  Nose: Nose normal.  Mouth/Throat: Oropharynx is clear and moist. No oropharyngeal exudate.  Eyes: Conjunctivae are normal. Pupils are equal, round, and reactive to light. Right eye exhibits no discharge. Left eye exhibits no discharge.  Neck: Normal range of motion. Neck supple.  Pulmonary/Chest: Effort normal. No respiratory distress. She has no wheezes. She has rales.  Exam nation of the chest shows loss in the right base over small area  laterally.  Musculoskeletal: Normal range of motion. She exhibits no edema or tenderness.  Lymphadenopathy:    She has no cervical adenopathy.  Neurological: She is alert and oriented to person, place, and time.  Skin: Skin is warm and dry. She is not diaphoretic.  Psychiatric: She has a normal mood and affect. Her behavior is normal. Judgment and thought content normal.  Nursing note and vitals reviewed.   ED Course  Procedures (including critical care time)  Labs Review Labs Reviewed - No data to display  Imaging  Review Dg Chest 2 View  11/21/2015  CLINICAL DATA:  Nonproductive cough, congestion, chest tightness, fever and fatigue for 5 days. Initial encounter. EXAM: CHEST  2 VIEW COMPARISON:  CT chest 03/23/2013. FINDINGS: The lungs are clear. Heart size is normal. No pneumothorax or pleural effusion. No focal bony abnormality. IMPRESSION: Negative chest. Electronically Signed   By: Drusilla Kanner M.D.   On: 11/21/2015 13:38     Visual Acuity Review  Right Eye Distance:   Left Eye Distance:   Bilateral Distance:    Right Eye Near:   Left Eye Near:    Bilateral Near:         MDM   1. URI, acute    New Prescriptions   AZITHROMYCIN (ZITHROMAX Z-PAK) 250 MG TABLET    Use as per package instructions   BENZONATATE (TESSALON) 200 MG CAPSULE    Take 1 capsule (200 mg total) by mouth 3 (three) times daily as needed for cough.   CHLORPHENIRAMINE-HYDROCODONE (TUSSIONEX PENNKINETIC ER) 10-8 MG/5ML SUER    Take 5 mLs by mouth 2 (two) times daily.  Plan: 1. Test/x-ray results and diagnosis reviewed with patient 2. rx as per orders; risks, benefits, potential side effects reviewed with patient 3. Recommend supportive treatment with rest and fluids. Place her on a antibiotic because of her smoking history and alcoholism. I told her this may be a viral but she may have a degree of COPD contributing to her cough and congestion. I've encouraged her to try and stop smoking. If she has any problems or is not improving she should see her primary care physician Dr. Anastasio Champion. 4. F/u with her PCP.     Lutricia Feil, PA-C 11/21/15 1359

## 2015-11-21 NOTE — Discharge Instructions (Signed)
Cool Mist Vaporizers °Vaporizers may help relieve the symptoms of a cough and cold. They add moisture to the air, which helps mucus to become thinner and less sticky. This makes it easier to breathe and cough up secretions. Cool mist vaporizers do not cause serious burns like hot mist vaporizers, which may also be called steamers or humidifiers. Vaporizers have not been proven to help with colds. You should not use a vaporizer if you are allergic to mold. °HOME CARE INSTRUCTIONS °· Follow the package instructions for the vaporizer. °· Do not use anything other than distilled water in the vaporizer. °· Do not run the vaporizer all of the time. This can cause mold or bacteria to grow in the vaporizer. °· Clean the vaporizer after each time it is used. °· Clean and dry the vaporizer well before storing it. °· Stop using the vaporizer if worsening respiratory symptoms develop. °  °This information is not intended to replace advice given to you by your health care provider. Make sure you discuss any questions you have with your health care provider. °  °Document Released: 08/22/2004 Document Revised: 11/30/2013 Document Reviewed: 04/14/2013 °Elsevier Interactive Patient Education ©2016 [employer] ° °Upper Respiratory Infection, Adult °Most upper respiratory infections (URIs) are a viral infection of the air passages leading to the lungs. A URI affects the nose, throat, and upper air passages. The most common type of URI is nasopharyngitis and is typically referred to as "the common cold." °URIs run their course and usually go away on their own. Most of the time, a URI does not require medical attention, but sometimes a bacterial infection in the upper airways can follow a viral infection. This is called a secondary infection. Sinus and middle ear infections are common types of secondary upper respiratory infections. °Bacterial pneumonia can also complicate a URI. A URI can worsen asthma and chronic obstructive  pulmonary disease (COPD). Sometimes, these complications can require emergency medical care and may be life threatening.  °CAUSES °Almost all URIs are caused by viruses. A virus is a type of germ and can spread from one person to another.  °RISKS FACTORS °You may be at risk for a URI if:  °· You smoke.   °· You have chronic heart or lung disease. °· You have a weakened defense (immune) system.   °· You are very young or very old.   °· You have nasal allergies or asthma. °· You work in crowded or poorly ventilated areas. °· You work in health care facilities or schools. °SIGNS AND SYMPTOMS  °Symptoms typically develop 2-3 days after you come in contact with a cold virus. Most viral URIs last 7-10 days. However, viral URIs from the influenza virus (flu virus) can last 14-18 days and are typically more severe. Symptoms may include:  °· Runny or stuffy (congested) nose.   °· Sneezing.   °· Cough.   °· Sore throat.   °· Headache.   °· Fatigue.   °· Fever.   °· Loss of appetite.   °· Pain in your forehead, behind your eyes, and over your cheekbones (sinus pain). °· Muscle aches.   °DIAGNOSIS  °Your health care provider may diagnose a URI by: °· Physical exam. °· Tests to check that your symptoms are not due to another condition such as: °¨ Strep throat. °¨ Sinusitis. °¨ Pneumonia. °¨ Asthma. °TREATMENT  °A URI goes away on its own with time. It cannot be cured with medicines, but medicines may be prescribed or recommended to relieve symptoms. Medicines may help: °· Reduce your fever. °· Reduce   your cough. °· Relieve nasal congestion. °HOME CARE INSTRUCTIONS  °· Take medicines only as directed by your health care provider.   °· Gargle warm saltwater or take cough drops to comfort your throat as directed by your health care provider. °· Use a warm mist humidifier or inhale steam from a shower to increase air moisture. This may make it easier to breathe. °· Drink enough fluid to keep your urine clear or pale yellow.   °· Eat  soups and other clear broths and maintain good nutrition.   °· Rest as needed.   °· Return to work when your temperature has returned to normal or as your health care provider advises. You may need to stay home longer to avoid infecting others. You can also use a face mask and careful hand washing to prevent spread of the virus. °· Increase the usage of your inhaler if you have asthma.   °· Do not use any tobacco products, including cigarettes, chewing tobacco, or electronic cigarettes. If you need help quitting, ask your health care provider. °PREVENTION  °The best way to protect yourself from getting a cold is to practice good hygiene.  °· Avoid oral or hand contact with people with cold symptoms.   °· Wash your hands often if contact occurs.   °There is no clear evidence that vitamin C, vitamin E, echinacea, or exercise reduces the chance of developing a cold. However, it is always recommended to get plenty of rest, exercise, and practice good nutrition.  °SEEK MEDICAL CARE IF:  °· You are getting worse rather than better.   °· Your symptoms are not controlled by medicine.   °· You have chills. °· You have worsening shortness of breath. °· You have brown or red mucus. °· You have yellow or brown nasal discharge. °· You have pain in your face, especially when you bend forward. °· You have a fever. °· You have swollen neck glands. °· You have pain while swallowing. °· You have white areas in the back of your throat. °SEEK IMMEDIATE MEDICAL CARE IF:  °· You have severe or persistent: °¨ Headache. °¨ Ear pain. °¨ Sinus pain. °¨ Chest pain. °· You have chronic lung disease and any of the following: °¨ Wheezing. °¨ Prolonged cough. °¨ Coughing up blood. °¨ A change in your usual mucus. °· You have a stiff neck. °· You have changes in your: °¨ Vision. °¨ Hearing. °¨ Thinking. °¨ Mood. °MAKE SURE YOU:  °· Understand these instructions. °· Will watch your condition. °· Will get help right away if you are not doing well or  get worse. °  °This information is not intended to replace advice given to you by your health care provider. Make sure you discuss any questions you have with your health care provider. °  °Document Released: 05/21/2001 Document Revised: 04/11/2015 Document Reviewed: 03/02/2014 °Elsevier Interactive Patient Education ©2016 [employer]vier Inc[employer] ° °

## 2015-12-15 ENCOUNTER — Telehealth: Payer: Self-pay

## 2015-12-15 NOTE — ED Notes (Signed)
Return to Work/FMLA form completed by Colon FlatteryBill Roemer PA. This nurse attempted to call patient x 3 with no answer. Message left to call [employer]. Paperwork is completed and in an envelope at the front desk [employer] Registration. Telephone number called was listed [phone].

## 2016-01-03 ENCOUNTER — Encounter: Payer: Self-pay | Admitting: *Deleted

## 2016-01-03 ENCOUNTER — Inpatient Hospital Stay
Admission: EM | Admit: 2016-01-03 | Discharge: 2016-01-05 | DRG: 897 | Disposition: A | Discharge status: Home / Self Care | Payer: 59 | Attending: Internal Medicine | Admitting: Internal Medicine

## 2016-01-03 DIAGNOSIS — F419 Anxiety disorder, unspecified: Secondary | ICD-10-CM | POA: Diagnosis present

## 2016-01-03 DIAGNOSIS — F1014 Alcohol abuse with alcohol-induced mood disorder: Secondary | ICD-10-CM | POA: Diagnosis present

## 2016-01-03 DIAGNOSIS — F17210 Nicotine dependence, cigarettes, uncomplicated: Secondary | ICD-10-CM | POA: Diagnosis present

## 2016-01-03 DIAGNOSIS — F1994 Other psychoactive substance use, unspecified with psychoactive substance-induced mood disorder: Secondary | ICD-10-CM

## 2016-01-03 DIAGNOSIS — E860 Dehydration: Secondary | ICD-10-CM | POA: Diagnosis present

## 2016-01-03 DIAGNOSIS — F10939 Alcohol use, unspecified with withdrawal, unspecified: Secondary | ICD-10-CM

## 2016-01-03 DIAGNOSIS — F10239 Alcohol dependence with withdrawal, unspecified: Secondary | ICD-10-CM | POA: Diagnosis not present

## 2016-01-03 DIAGNOSIS — E876 Hypokalemia: Secondary | ICD-10-CM | POA: Diagnosis present

## 2016-01-03 DIAGNOSIS — E871 Hypo-osmolality and hyponatremia: Secondary | ICD-10-CM | POA: Diagnosis present

## 2016-01-03 DIAGNOSIS — F1010 Alcohol abuse, uncomplicated: Secondary | ICD-10-CM

## 2016-01-03 DIAGNOSIS — Z79899 Other long term (current) drug therapy: Secondary | ICD-10-CM

## 2016-01-03 HISTORY — DX: Anxiety disorder, unspecified: F41.9

## 2016-01-03 LAB — URINE DRUG SCREEN, QUALITATIVE (ARMC ONLY)
AMPHETAMINES, UR SCREEN: NOT DETECTED
BARBITURATES, UR SCREEN: NOT DETECTED
BENZODIAZEPINE, UR SCRN: NOT DETECTED
Cannabinoid 50 Ng, Ur ~~LOC~~: NOT DETECTED
Cocaine Metabolite,Ur ~~LOC~~: NOT DETECTED
MDMA (Ecstasy)Ur Screen: NOT DETECTED
METHADONE SCREEN, URINE: NOT DETECTED
OPIATE, UR SCREEN: NOT DETECTED
Phencyclidine (PCP) Ur S: NOT DETECTED
TRICYCLIC, UR SCREEN: NOT DETECTED

## 2016-01-03 LAB — COMPREHENSIVE METABOLIC PANEL
ALT: 60 U/L — AB (ref 14–54)
ANION GAP: 16 — AB (ref 5–15)
AST: 79 U/L — ABNORMAL HIGH (ref 15–41)
Albumin: 4.1 g/dL (ref 3.5–5.0)
Alkaline Phosphatase: 104 U/L (ref 38–126)
BUN: 9 mg/dL (ref 6–20)
CHLORIDE: 87 mmol/L — AB (ref 101–111)
CO2: 19 mmol/L — AB (ref 22–32)
Calcium: 8.1 mg/dL — ABNORMAL LOW (ref 8.9–10.3)
Creatinine, Ser: 0.8 mg/dL (ref 0.44–1.00)
Glucose, Bld: 82 mg/dL (ref 65–99)
POTASSIUM: 3.6 mmol/L (ref 3.5–5.1)
SODIUM: 122 mmol/L — AB (ref 135–145)
Total Bilirubin: 1.7 mg/dL — ABNORMAL HIGH (ref 0.3–1.2)
Total Protein: 7.5 g/dL (ref 6.5–8.1)

## 2016-01-03 LAB — CBC WITH DIFFERENTIAL/PLATELET
Basophils Absolute: 0 10*3/uL (ref 0–0.1)
Basophils Relative: 0 %
EOS ABS: 0.1 10*3/uL (ref 0–0.7)
EOS PCT: 1 %
HCT: 41.5 % (ref 35.0–47.0)
Hemoglobin: 14.3 g/dL (ref 12.0–16.0)
LYMPHS ABS: 1.3 10*3/uL (ref 1.0–3.6)
Lymphocytes Relative: 18 %
MCH: 31.6 pg (ref 26.0–34.0)
MCHC: 34.4 g/dL (ref 32.0–36.0)
MCV: 91.8 fL (ref 80.0–100.0)
MONO ABS: 0.5 10*3/uL (ref 0.2–0.9)
Monocytes Relative: 7 %
Neutro Abs: 5.2 10*3/uL (ref 1.4–6.5)
Neutrophils Relative %: 74 %
PLATELETS: 177 10*3/uL (ref 150–440)
RBC: 4.52 MIL/uL (ref 3.80–5.20)
RDW: 13.7 % (ref 11.5–14.5)
WBC: 7 10*3/uL (ref 3.6–11.0)

## 2016-01-03 LAB — ACETAMINOPHEN LEVEL

## 2016-01-03 LAB — SALICYLATE LEVEL

## 2016-01-03 LAB — ETHANOL

## 2016-01-03 MED ORDER — LORAZEPAM 2 MG/ML IJ SOLN
1.0000 mg | Freq: Once | INTRAMUSCULAR | Status: DC
Start: 2016-01-03 — End: 2016-01-04

## 2016-01-03 MED ORDER — LORAZEPAM 2 MG/ML IJ SOLN
0.0000 mg | Freq: Four times a day (QID) | INTRAMUSCULAR | Status: DC
  Administered 2016-01-03 (×2): 2 mg via INTRAVENOUS
  Filled 2016-01-03: qty 1

## 2016-01-03 MED ORDER — ACETAMINOPHEN 650 MG RE SUPP: 650.0000 mg | Freq: Four times a day (QID) | RECTAL | Status: DC | PRN

## 2016-01-03 MED ORDER — LORAZEPAM 2 MG PO TABS: 0.0000 mg | ORAL_TABLET | Freq: Two times a day (BID) | ORAL | Status: DC

## 2016-01-03 MED ORDER — THIAMINE HCL 100 MG/ML IJ SOLN: 100.0000 mg | Freq: Every day | INTRAMUSCULAR | Status: DC

## 2016-01-03 MED ORDER — ONDANSETRON HCL 4 MG/2ML IJ SOLN: 4.0000 mg | Freq: Four times a day (QID) | INTRAMUSCULAR | Status: DC | PRN

## 2016-01-03 MED ORDER — VITAMIN B-1 100 MG PO TABS
100.0000 mg | ORAL_TABLET | Freq: Every day | ORAL | Status: DC
  Administered 2016-01-03 – 2016-01-04 (×2): 100 mg via ORAL

## 2016-01-03 MED ORDER — LORAZEPAM 2 MG/ML IJ SOLN: 1.0000 mg | Freq: Once | INTRAMUSCULAR | Status: DC

## 2016-01-03 MED ORDER — NICOTINE 21 MG/24HR TD PT24
21.0000 mg | MEDICATED_PATCH | Freq: Every day | TRANSDERMAL | Status: DC
  Administered 2016-01-03 – 2016-01-05 (×3): 21 mg via TRANSDERMAL
  Filled 2016-01-03 (×3): qty 1

## 2016-01-03 MED ORDER — VITAMIN B-1 100 MG PO TABS
100.0000 mg | ORAL_TABLET | Freq: Every day | ORAL | Status: DC
  Administered 2016-01-03: 100 mg via ORAL
  Filled 2016-01-03 (×3): qty 1

## 2016-01-03 MED ORDER — LORAZEPAM 2 MG/ML IJ SOLN: 0.0000 mg | Freq: Two times a day (BID) | INTRAMUSCULAR | Status: DC

## 2016-01-03 MED ORDER — SODIUM CHLORIDE 0.9 % IV SOLN
INTRAVENOUS | Status: DC
  Administered 2016-01-03 – 2016-01-05 (×3): via INTRAVENOUS

## 2016-01-03 MED ORDER — ENOXAPARIN SODIUM 40 MG/0.4ML ~~LOC~~ SOLN
40.0000 mg | SUBCUTANEOUS | Status: DC
  Administered 2016-01-04: 21:00:00 40 mg via SUBCUTANEOUS
  Filled 2016-01-03: qty 0.4

## 2016-01-03 MED ORDER — LORAZEPAM 2 MG PO TABS
0.0000 mg | ORAL_TABLET | Freq: Four times a day (QID) | ORAL | Status: DC
  Administered 2016-01-04 (×2): 1 mg via ORAL
  Administered 2016-01-04: 2 mg via ORAL
  Filled 2016-01-03 (×3): qty 1

## 2016-01-03 MED ORDER — SODIUM CHLORIDE 0.9% FLUSH
3.0000 mL | Freq: Two times a day (BID) | INTRAVENOUS | Status: DC
  Administered 2016-01-03 – 2016-01-04 (×2): 3 mL via INTRAVENOUS

## 2016-01-03 MED ORDER — SODIUM CHLORIDE 0.9 % IV BOLUS (SEPSIS)
1000.0000 mL | Freq: Once | INTRAVENOUS | Status: AC
  Administered 2016-01-03: 1000 mL via INTRAVENOUS

## 2016-01-03 MED ORDER — LORAZEPAM 1 MG PO TABS: 1.0000 mg | ORAL_TABLET | Freq: Four times a day (QID) | ORAL | Status: DC | PRN

## 2016-01-03 MED ORDER — ACETAMINOPHEN 325 MG PO TABS
650.0000 mg | ORAL_TABLET | Freq: Four times a day (QID) | ORAL | Status: DC | PRN
Start: 2016-01-03 — End: 2016-01-05
  Administered 2016-01-04: 650 mg via ORAL
  Filled 2016-01-03: qty 2

## 2016-01-03 MED ORDER — ONDANSETRON HCL 4 MG PO TABS
4.0000 mg | ORAL_TABLET | Freq: Four times a day (QID) | ORAL | Status: DC | PRN
  Administered 2016-01-05: 08:00:00 4 mg via ORAL
  Filled 2016-01-03: qty 1

## 2016-01-03 MED ORDER — ADULT MULTIVITAMIN W/MINERALS CH
1.0000 | ORAL_TABLET | Freq: Every day | ORAL | Status: DC
  Administered 2016-01-03 – 2016-01-04 (×2): 1 via ORAL
  Filled 2016-01-03 (×2): qty 1

## 2016-01-03 MED ORDER — LORAZEPAM 2 MG/ML IJ SOLN
INTRAMUSCULAR | Status: AC
  Administered 2016-01-03: 2 mg via INTRAVENOUS
  Filled 2016-01-03: qty 1

## 2016-01-03 MED ORDER — LORAZEPAM 2 MG/ML IJ SOLN: 1.0000 mg | Freq: Four times a day (QID) | INTRAMUSCULAR | Status: DC | PRN

## 2016-01-03 MED ORDER — FOLIC ACID 1 MG PO TABS
1.0000 mg | ORAL_TABLET | Freq: Every day | ORAL | Status: DC
  Administered 2016-01-03 – 2016-01-04 (×2): 1 mg via ORAL
  Filled 2016-01-03 (×2): qty 1

## 2016-01-03 NOTE — ED Provider Notes (Signed)
[facility] Emergency Department Provider Note  ____________________________________________  Time seen: Approximately 6:10 PM  I have reviewed the triage vital signs and the nursing notes.   HISTORY  Chief Complaint Alcohol Problem    HPI Allison Fowler is a 50 y.o. female with alcoholism who presents for evaluation of acute alcohol withdrawal with desire for detox. Patient reports that she drinks "as much alcohol I can get my hands on". She last drank yesterday evening. She has been tremulous today, gradual onset, constant since onset, currently severe. She has a history of seizures in the setting of alcohol withdrawal. No SI, HI or auto visual hallucinations. No vomiting, diarrhea, fevers chills, coughing, sneezing or runny nose.   Past Medical History  Diagnosis Date  . Alcoholism (HCC)   . Kidney stone   . Anxiety     Patient Active Problem List   Diagnosis Date Noted  . Alcohol withdrawal (HCC) 01/03/2016    Past Surgical History  Procedure Laterality Date  . Lithotripsy    . Leg surgery      No current outpatient prescriptions on file.  Allergies Review of patient's allergies indicates no known allergies.  Family History  Problem Relation Age of Onset  . Cancer Father   . Alcohol abuse Father     Social History Social History  Substance Use Topics  . Smoking status: Current Every Day Smoker -- 0.50 packs/day    Types: Cigarettes  . Smokeless tobacco: None  . Alcohol Use: 2.4 oz/week    4 Standard drinks or equivalent per week    Review of Systems Constitutional: No fever/chills Eyes: No visual changes. ENT: No sore throat. Cardiovascular: Denies chest pain. Respiratory: Denies shortness of breath. Gastrointestinal: No abdominal pain.  No nausea, no vomiting.  No diarrhea.  No constipation. Genitourinary: Negative for dysuria. Musculoskeletal: Negative for back pain. Skin: Negative for rash. Neurological: Negative for  headaches, focal weakness or numbness.  10-point ROS otherwise negative.  ____________________________________________   PHYSICAL EXAM:  VITAL SIGNS: ED Triage Vitals  Enc Vitals Group     BP 01/03/16 1755 167/82 mmHg     Pulse Rate 01/03/16 1755 115     Resp 01/03/16 1755 20     Temp 01/03/16 1755 98 F (36.7 C)     Temp Source 01/03/16 1755 Oral     SpO2 01/03/16 1755 99 %     Weight 01/03/16 1755 170 lb (77.111 kg)     Height 01/03/16 1755  (1.651 m)     Head Cir --      Peak Flow --      Pain Score 01/03/16 1756 10     Pain Loc --      Pain Edu? --      Excl. in GC? --     Constitutional: Alert and oriented. Appears tremulous. Eyes: Conjunctivae are normal. PERRL. EOMI. Head: Atraumatic. Nose: No congestion/rhinnorhea. Mouth/Throat: Mucous membranes are moist.  Oropharynx non-erythematous. Neck: No stridor.  Cardiovascular: Tachycardic rate, regular rhythm. Grossly normal heart sounds.  Good peripheral circulation. Respiratory: Normal respiratory effort.  No retractions. Lungs CTAB. Gastrointestinal: Soft and nontender. No distention.  No CVA tenderness. Genitourinary: deferred Musculoskeletal: No lower extremity tenderness nor edema.  No joint effusions. Neurologic:  Normal speech and language. No gross focal neurologic deficits are appreciated.  Skin:  Skin is warm, dry and intact. No rash noted. Psychiatric: Mood and affect are normal. Speech and behavior are normal.  ____________________________________________   LABS (all labs  ordered are listed, but only abnormal results are displayed)  Labs Reviewed  COMPREHENSIVE METABOLIC PANEL - Abnormal; Notable for the following:    Sodium 122 (*)    Chloride 87 (*)    CO2 19 (*)    Calcium 8.1 (*)    AST 79 (*)    ALT 60 (*)    Total Bilirubin 1.7 (*)    Anion gap 16 (*)    All other components within normal limits  ACETAMINOPHEN LEVEL - Abnormal; Notable for the following:    Acetaminophen (Tylenol),  Serum <10 (*)    All other components within normal limits  CBC WITH DIFFERENTIAL/PLATELET  ETHANOL  SALICYLATE LEVEL  URINE DRUG SCREEN, QUALITATIVE ([facility] ONLY)  CBC  BASIC METABOLIC PANEL   ____________________________________________  EKG  none ____________________________________________  RADIOLOGY  none ____________________________________________   PROCEDURES  Procedure(s) performed: None  Critical Care performed: No  ____________________________________________   INITIAL IMPRESSION / ASSESSMENT AND PLAN / ED COURSE  Pertinent labs & imaging results that were available during my care of the patient were reviewed by me and considered in my medical decision making (see chart for details).  Allison Fowler is a 50 y.o. female with alcoholism who presents for evaluation of acute alcohol withdrawal with desire for detox. On exam, she does appear to be in active alcohol withdrawal, she is tachycardic, tremulous, CIWA score 15. We'll treat with liberal Ativan, obtain screening labs and anticipate admission.  ----------------------------------------- 7:53 PM on 01/03/2016 -----------------------------------------  Tachycardia improved. Patient with initial CIWA score of 15, improving with IV Ativan. Labs notable for CVA hyponatremia with sodium 122, IV fluids ordered. AST, ALT and T bili mildly elevated. Normal CBC. Undetectable ethanol, acetaminophen and salicylate levels. Given active alcohol withdrawal symptoms and hyponatremia, case discussed with hospice for admission at this time. ____________________________________________   FINAL CLINICAL IMPRESSION(S) / ED DIAGNOSES  Final diagnoses:  Alcohol withdrawal, with unspecified complication (HCC)  Hyponatremia      Gayla Doss, MD 01/04/16 0010

## 2016-01-03 NOTE — H&P (Signed)
[facility] - [place] [facility]   PATIENT NAME: Allison Fowler    MR#:  [id number]  DATE OF BIRTH:  20-Jun-1966  DATE OF ADMISSION:  01/03/2016  PRIMARY CARE PHYSICIAN: No primary care provider on file.   REQUESTING/REFERRING PHYSICIAN: Leone Brand MD  CHIEF COMPLAINT:   Chief Complaint  Patient presents with  . Alcohol Problem    HISTORY OF PRESENT ILLNESS: Allison Fowler  is a 50 y.o. female with a known history of   Alchol abuse  and anxiety who usually brings drinks. She has been drinking for the last 8 days continuously. Large amount of beer. She stopped drinking yesterday and has been tremulous and anxious. Therefore came to the ER. In the ER she received a few doses of Ativan and were asked to admit the patient for alcohol withdrawal. She otherwise denies any chest pain or shortness of breath.    PAST MEDICAL HISTORY:   Past Medical History  Diagnosis Date  . Alcoholism (HCC)   . Kidney stone   . Anxiety     PAST SURGICAL HISTORY: Past Surgical History  Procedure Laterality Date  . Lithotripsy    . Leg surgery      SOCIAL HISTORY:  Social History  Substance Use Topics  . Smoking status: Current Every Day Smoker -- 0.50 packs/day    Types: Cigarettes  . Smokeless tobacco: Not on file  . Alcohol Use: 2.4 oz/week    4 Standard drinks or equivalent per week    FAMILY HISTORY:  Family History  Problem Relation Age of Onset  . Cancer Father   . Alcohol abuse Father     DRUG ALLERGIES: No Known Allergies  REVIEW OF SYSTEMS:   CONSTITUTIONAL: No fever, fatigue or weakness.  EYES: No blurred or double vision.  EARS, NOSE, AND THROAT: No tinnitus or ear pain.  RESPIRATORY: No cough, shortness of breath, wheezing or hemoptysis.  CARDIOVASCULAR: No chest pain, orthopnea, edema.  GASTROINTESTINAL: No nausea, vomiting, diarrhea or abdominal pain.  GENITOURINARY: No dysuria, hematuria.  ENDOCRINE: No polyuria, nocturia,  HEMATOLOGY:  No anemia, easy bruising or bleeding SKIN: No rash or lesion. MUSCULOSKELETAL: No joint pain or arthritis.   NEUROLOGIC: No tingling, numbness, weakness.  PSYCHIATRY: No anxiety or depression.   MEDICATIONS AT HOME:  Prior to Admission medications   Medication Sig Start Date End Date Taking? Authorizing Provider  gabapentin (NEURONTIN) 100 MG capsule Take 100 mg by mouth 3 (three) times daily.   Yes Historical Provider, MD  benzonatate (TESSALON) 200 MG capsule Take 1 capsule (200 mg total) by mouth 3 (three) times daily as needed for cough. Patient not taking: Reported on 01/03/2016 11/21/15   Lutricia Feil, PA-C  chlorpheniramine-HYDROcodone [facility]) 10-8 MG/5ML SUER Take 5 mLs by mouth 2 (two) times daily. Patient not taking: Reported on 01/03/2016 11/21/15   Lutricia Feil, PA-C      PHYSICAL EXAMINATION:   VITAL SIGNS: Blood pressure 167/82, pulse 115, temperature 98 F (36.7 C), temperature source Oral, resp. rate 20, height  (1.651 m), weight 77.111 kg (170 lb), last menstrual period 12/20/2015, SpO2 99 %.  GENERAL:  50 y.o.-year-old patient lying in the bed with no acute distress. Little tremulous  EYES: Pupils equal, round, reactive to light and accommodation. No scleral icterus. Extraocular muscles intact.  HEENT: Head atraumatic, normocephalic. Oropharynx and nasopharynx clear.  NECK:  Supple, no jugular venous distention. No thyroid enlargement, no tenderness.  LUNGS: Normal breath sounds bilaterally,  no wheezing, rales,rhonchi or crepitation. No use of accessory muscles of respiration.  CARDIOVASCULAR: S1, S2 normal. No murmurs, rubs, or gallops.  ABDOMEN: Soft, nontender, nondistended. Bowel sounds present. No organomegaly or mass.  EXTREMITIES: No pedal edema, cyanosis, or clubbing.  NEUROLOGIC: Cranial nerves II through XII are intact. Muscle strength 5/5 in all extremities. Sensation intact. Gait not checked. Generalized tremor PSYCHIATRIC:  The patient is alert and oriented x 3. Anxious SKIN: No obvious rash, lesion, or ulcer.   LABORATORY PANEL:   CBC  Recent Labs Lab 01/03/16 1817  WBC 7.0  HGB 14.3  HCT 41.5  PLT 177  MCV 91.8  MCH 31.6  MCHC 34.4  RDW 13.7  LYMPHSABS 1.3  MONOABS 0.5  EOSABS 0.1  BASOSABS 0.0   ------------------------------------------------------------------------------------------------------------------  Chemistries   Recent Labs Lab 01/03/16 1817  NA 122*  K 3.6  CL 87*  CO2 19*  GLUCOSE 82  BUN 9  CREATININE 0.80  CALCIUM 8.1*  AST 79*  ALT 60*  ALKPHOS 104  BILITOT 1.7*   ------------------------------------------------------------------------------------------------------------------ estimated creatinine clearance is 89.2 mL/min (by C-G formula based on Cr of 0.8). ------------------------------------------------------------------------------------------------------------------ No results for input(s): TSH, T4TOTAL, T3FREE, THYROIDAB in the last 72 hours.  Invalid input(s): FREET3   Coagulation profile No results for input(s): INR, PROTIME in the last 168 hours. ------------------------------------------------------------------------------------------------------------------- No results for input(s): DDIMER in the last 72 hours. -------------------------------------------------------------------------------------------------------------------  Cardiac Enzymes No results for input(s): CKMB, TROPONINI, MYOGLOBIN in the last 168 hours.  Invalid input(s): CK ------------------------------------------------------------------------------------------------------------------ Invalid input(s): POCBNP  ---------------------------------------------------------------------------------------------------------------  Urinalysis No results found for: COLORURINE, APPEARANCEUR, LABSPEC, PHURINE, GLUCOSEU, HGBUR, BILIRUBINUR, KETONESUR, PROTEINUR, UROBILINOGEN, NITRITE,  LEUKOCYTESUR   RADIOLOGY: No results found.  EKG: No orders found for this or any previous visit.  IMPRESSION AND PLAN: Patient is a 50 year old white female without call abuse presents with withdrawal-like symptoms  1. Acute alcohol withdrawal: We'll place patient on [place] protocol. Asked psychiatry to see  2. Nicotine abuse smoking cessation provided 4 4 minutes spent recommended to stop smoking she'll be started on a nicotine patch  3. Miscellaneous we'll do Lovenox for DVT prophylaxis    All the records are reviewed and case discussed with ED provider. Management plans discussed with the patient, family and they are in agreement.  CODE STATUS:    Code Status Orders        Start     Ordered   01/03/16 2016  Full code   Continuous     01/03/16 2016    Code Status History    Date Active Date Inactive Code Status Order ID Comments User Context   This patient has a current code status but no historical code status.       TOTAL TIME TAKING CARE OF THIS PATIENT: .    Auburn Bilberry M.D on 01/03/2016 at 8:23 PM  Between 7am to 6pm - Pager - [phone]  After 6pm go to [email or web address] - password [email or web address] [facility]  [place] [facility]  Office  [phone]  CC: Primary care physician; No primary care provider on file.

## 2016-01-03 NOTE — ED Notes (Signed)
Pt to triage via wheelchair.  Pt requesting alcohol detox.  Denies other drug use.  Pt denies SI or HI.  Pt last drank was yesterday.

## 2016-01-04 DIAGNOSIS — F1010 Alcohol abuse, uncomplicated: Secondary | ICD-10-CM

## 2016-01-04 DIAGNOSIS — F1994 Other psychoactive substance use, unspecified with psychoactive substance-induced mood disorder: Secondary | ICD-10-CM

## 2016-01-04 LAB — BASIC METABOLIC PANEL
ANION GAP: 8 (ref 5–15)
BUN: 7 mg/dL (ref 6–20)
CALCIUM: 8.4 mg/dL — AB (ref 8.9–10.3)
CHLORIDE: 105 mmol/L (ref 101–111)
CO2: 26 mmol/L (ref 22–32)
Creatinine, Ser: 0.69 mg/dL (ref 0.44–1.00)
GFR calc non Af Amer: >60 mL/min (ref >60)
GLUCOSE: 123 mg/dL — AB (ref 65–99)
Potassium: 3.4 mmol/L — ABNORMAL LOW (ref 3.5–5.1)
Sodium: 139 mmol/L (ref 135–145)

## 2016-01-04 LAB — CBC
HEMATOCRIT: 42.6 % (ref 35.0–47.0)
HEMOGLOBIN: 14.4 g/dL (ref 12.0–16.0)
MCH: 30.8 pg (ref 26.0–34.0)
MCHC: 33.7 g/dL (ref 32.0–36.0)
MCV: 91.2 fL (ref 80.0–100.0)
Platelets: 150 10*3/uL (ref 150–440)
RBC: 4.67 MIL/uL (ref 3.80–5.20)
RDW: 13.5 % (ref 11.5–14.5)
WBC: 4.7 10*3/uL (ref 3.6–11.0)

## 2016-01-04 MED ORDER — ADULT MULTIVITAMIN W/MINERALS CH
1.0000 | ORAL_TABLET | Freq: Every day | ORAL | Status: DC
  Administered 2016-01-05: 1 via ORAL
  Filled 2016-01-04: qty 1

## 2016-01-04 MED ORDER — LOPERAMIDE HCL 2 MG PO CAPS
2.0000 mg | ORAL_CAPSULE | ORAL | Status: DC | PRN
  Administered 2016-01-04 (×2): 2 mg via ORAL
  Filled 2016-01-04 (×2): qty 1

## 2016-01-04 MED ORDER — FOLIC ACID 1 MG PO TABS
1.0000 mg | ORAL_TABLET | Freq: Every day | ORAL | Status: DC
  Administered 2016-01-05: 08:00:00 1 mg via ORAL
  Filled 2016-01-04: qty 1

## 2016-01-04 MED ORDER — NALTREXONE HCL 50 MG PO TABS
25.0000 mg | ORAL_TABLET | Freq: Every day | ORAL | Status: DC
  Administered 2016-01-05: 25 mg via ORAL
  Filled 2016-01-04 (×2): qty 1

## 2016-01-04 MED ORDER — THIAMINE HCL 100 MG/ML IJ SOLN: 100.0000 mg | Freq: Every day | INTRAMUSCULAR | Status: DC

## 2016-01-04 MED ORDER — GABAPENTIN 300 MG PO CAPS
300.0000 mg | ORAL_CAPSULE | Freq: Three times a day (TID) | ORAL | Status: DC
  Administered 2016-01-04 – 2016-01-05 (×2): 300 mg via ORAL
  Filled 2016-01-04 (×2): qty 1

## 2016-01-04 MED ORDER — VITAMIN B-1 100 MG PO TABS
100.0000 mg | ORAL_TABLET | Freq: Every day | ORAL | Status: DC
  Administered 2016-01-05: 100 mg via ORAL
  Filled 2016-01-04: qty 1

## 2016-01-04 MED ORDER — LOPERAMIDE HCL 2 MG PO CAPS
4.0000 mg | ORAL_CAPSULE | Freq: Once | ORAL | Status: AC
  Administered 2016-01-04: 03:00:00 4 mg via ORAL
  Filled 2016-01-04: qty 2

## 2016-01-04 MED ORDER — POTASSIUM CHLORIDE CRYS ER 20 MEQ PO TBCR
40.0000 meq | EXTENDED_RELEASE_TABLET | Freq: Once | ORAL | Status: AC
  Administered 2016-01-04: 16:00:00 40 meq via ORAL
  Filled 2016-01-04: qty 2

## 2016-01-04 MED ORDER — LORAZEPAM 2 MG/ML IJ SOLN: 1.0000 mg | Freq: Four times a day (QID) | INTRAMUSCULAR | Status: DC | PRN

## 2016-01-04 MED ORDER — LORAZEPAM 1 MG PO TABS
1.0000 mg | ORAL_TABLET | Freq: Four times a day (QID) | ORAL | Status: DC | PRN
  Administered 2016-01-04 – 2016-01-05 (×2): 1 mg via ORAL
  Filled 2016-01-04 (×2): qty 1

## 2016-01-04 NOTE — Care Management (Signed)
Admitted to [facility] with the diagnosis of alcohol withdrawal. Lives with her grandmother, Jeryl [place]. Grandmother goes to [facility] x [facility].  Mother is [place] [phone]). Works at [employer] last 8-9 months. Applied for [facility] 12/15/15, but wasn't eligible for this benefit.  States when she bing drinks her appetite isn't very good. No falls. Takes care of all basic and instrumental activities of daily living herself, drives.  No primary care physician. List of physicians accepting new patients given to Allison Fowler Requested information on detox programs. Dede Query Clinical Social updated. Gwenette Greet RN MSN CCM Care Management [phone]

## 2016-01-04 NOTE — Plan of Care (Addendum)
-  VSS, afebrile. No c/o pain. -IVF infusing as ordered  -CIWA Q6H, Ativan given x1 -c/o diarrhea, Imodium given w/ noted relief  Problem: Health Behavior/Discharge Planning: Goal: Ability to manage health-related needs will improve Outcome: Progressing Pt from home w/ grandmother. Pt would like information regarding how to find a PCP. Case Management consulted.  Problem: Tissue Perfusion: Goal: Risk factors for ineffective tissue perfusion will decrease Outcome: Not Progressing Pt refuses SCD's, Teds, & Lovenox. Education provided.

## 2016-01-04 NOTE — Progress Notes (Signed)
[facility] Physicians - [place] at [facility]                                                                                                                                                                                            Patient Demographics   Allison Fowler, is a 50 y.o. female, DOB - May 12, 1966, ZOX:[id number]  Admit date - 01/03/2016   Admitting Physician Auburn Bilberry, MD  Outpatient Primary MD for the patient is No primary care provider on file.   LOS - 1  Subjective: Patient is little tremulous. But symptoms improved     Review of Systems:   CONSTITUTIONAL: No documented fever. No fatigue, weakness. No weight gain, no weight loss.  EYES: No blurry or double vision.  ENT: No tinnitus. No postnasal drip. No redness of the oropharynx.  RESPIRATORY: No cough, no wheeze, no hemoptysis. No dyspnea.  CARDIOVASCULAR: No chest pain. No orthopnea. No palpitations. No syncope.  GASTROINTESTINAL: No nausea, no vomiting or diarrhea. No abdominal pain. No melena or hematochezia.  GENITOURINARY: No dysuria or hematuria.  ENDOCRINE: No polyuria or nocturia. No heat or cold intolerance.  HEMATOLOGY: No anemia. No bruising. No bleeding.  INTEGUMENTARY: No rashes. No lesions.  MUSCULOSKELETAL: No arthritis. No swelling. No gout.  NEUROLOGIC: No numbness, tingling, or ataxia. No seizure-type activity. Tremulous PSYCHIATRIC: No anxiety. No insomnia. No ADD.    Vitals:   Filed Vitals:   01/03/16 2142 01/03/16 2209 01/04/16 0000 01/04/16 0504  BP: 127/78 123/71 144/67 121/59  Pulse: 108 115 109 103  Temp:  98.8 F (37.1 C)  98.5 F (36.9 C)  TempSrc:  Oral  Oral  Resp: 16   20  Height:   (1.651 m)    Weight:  79.379 kg (175 lb)    SpO2: 100% 97%  100%    Wt Readings from Last 3 Encounters:  01/03/16 79.379 kg (175 lb)  11/21/15 81.647 kg (180 lb)  08/10/15 83.915 kg (185 lb)     Intake/Output Summary (Last 24 hours) at 01/04/16 1322 Last data  filed at 01/04/16 0929  Gross per 24 hour  Intake    240 ml  Output      0 ml  Net    240 ml    Physical Exam:   GENERAL: Pleasant-appearing in no apparent distress.  HEAD, EYES, EARS, NOSE AND THROAT: Atraumatic, normocephalic. Extraocular muscles are intact. Pupils equal and reactive to light. Sclerae anicteric. No conjunctival injection. No oro-pharyngeal erythema.  NECK: Supple. There is no jugular venous distention. No bruits, no lymphadenopathy, no thyromegaly.  HEART: Regular rate and rhythm,. No murmurs,  no rubs, no clicks.  LUNGS: Clear to auscultation bilaterally. No rales or rhonchi. No wheezes.  ABDOMEN: Soft, flat, nontender, nondistended. Has good bowel sounds. No hepatosplenomegaly appreciated.  EXTREMITIES: No evidence of any cyanosis, clubbing, or peripheral edema.  +2 pedal and radial pulses bilaterally.  NEUROLOGIC: The patient is alert, awake, and oriented x3 with no focal motor or sensory deficits appreciated bilaterally.  SKIN: Moist and warm with no rashes appreciated.  Psych: Not anxious, depressed LN: No inguinal LN enlargement    Antibiotics   Anti-infectives    None      Medications   Scheduled Meds: . enoxaparin (LOVENOX) injection  40 mg Subcutaneous Q24H  . folic acid  1 mg Oral Daily  . multivitamin with minerals  1 tablet Oral Daily  . nicotine  21 mg Transdermal Daily  . sodium chloride flush  3 mL Intravenous Q12H  . thiamine  100 mg Oral Daily   Or  . thiamine  100 mg Intravenous Daily   Continuous Infusions: . sodium chloride 75 mL/hr at 01/04/16 1051   PRN Meds:.acetaminophen **OR** acetaminophen, [COMPLETED] loperamide **FOLLOWED BY** loperamide, LORazepam **OR** LORazepam, ondansetron **OR** ondansetron (ZOFRAN) IV   Data Review:   Micro Results No results found for this or any previous visit (from the past 240 hour(s)).  Radiology Reports No results found.   CBC  Recent Labs Lab 01/03/16 1817 01/04/16 0549  WBC 7.0  4.7  HGB 14.3 14.4  HCT 41.5 42.6  PLT 177 150  MCV 91.8 91.2  MCH 31.6 30.8  MCHC 34.4 33.7  RDW 13.7 13.5  LYMPHSABS 1.3  --   MONOABS 0.5  --   EOSABS 0.1  --   BASOSABS 0.0  --     Chemistries   Recent Labs Lab 01/03/16 1817 01/04/16 0549  NA 122* 139  K 3.6 3.4*  CL 87* 105  CO2 19* 26  GLUCOSE 82 123*  BUN 9 7  CREATININE 0.80 0.69  CALCIUM 8.1* 8.4*  AST 79*  --   ALT 60*  --   ALKPHOS 104  --   BILITOT 1.7*  --    ------------------------------------------------------------------------------------------------------------------ estimated creatinine clearance is 90.6 mL/min (by C-G formula based on Cr of 0.69). ------------------------------------------------------------------------------------------------------------------ No results for input(s): HGBA1C in the last 72 hours. ------------------------------------------------------------------------------------------------------------------ No results for input(s): CHOL, HDL, LDLCALC, TRIG, CHOLHDL, LDLDIRECT in the last 72 hours. ------------------------------------------------------------------------------------------------------------------ No results for input(s): TSH, T4TOTAL, T3FREE, THYROIDAB in the last 72 hours.  Invalid input(s): FREET3 ------------------------------------------------------------------------------------------------------------------ No results for input(s): VITAMINB12, FOLATE, FERRITIN, TIBC, IRON, RETICCTPCT in the last 72 hours.  Coagulation profile No results for input(s): INR, PROTIME in the last 168 hours.  No results for input(s): DDIMER in the last 72 hours.  Cardiac Enzymes No results for input(s): CKMB, TROPONINI, MYOGLOBIN in the last 168 hours.  Invalid input(s): CK ------------------------------------------------------------------------------------------------------------------ Invalid input(s): POCBNP    Assessment & Plan    IMPRESSION AND PLAN: Patient is a  50 year old white female without call abuse presents with withdrawal-like symptoms  1. Acute alcohol withdrawal: Continue CIWA protocol  2. Nicotine abuse smoking cessation provided 4   minutes spent recommended to stop smokings on nicotine patch 3. Miscellaneous we'll do Lovenox for DVT prophylaxis     Code Status Orders        Start     Ordered   01/03/16 2016  Full code   Continuous     01/03/16 2016    Code Status History    Date  Active Date Inactive Code Status Order ID Comments User Context   This patient has a current code status but no historical code status.           Consults  psych  DVT Prophylaxis  lovenox  Lab Results  Component Value Date   PLT 150 01/04/2016     Time Spent in minutes     Auburn Bilberry M.D on 01/04/2016 at 1:22 PM  Between 7am to 6pm - Pager - [phone]  After 6pm go to [email or web address] - password [email or web address] [facility]  [facility]   Office  [phone]

## 2016-01-04 NOTE — Consult Note (Signed)
[facility] Face-to-Face Psychiatry Consult   Reason for Consult:  Consult for this 50 year old woman with a history of alcohol abuse. Assistance with substance abuse treatment. Referring Physician:  Posey Pronto Patient Identification: Allison Fowler MRN:  [id number] Principal Diagnosis: Alcohol abuse Diagnosis:   Patient Active Problem List   Diagnosis Date Noted  . Substance induced mood disorder ([place]) [F19.94] 01/04/2016  . Alcohol abuse [F10.10] 01/04/2016  . Alcohol withdrawal ([place]) [F10.239] 01/03/2016    Total Time spent with patient: 1 hour  Subjective:   Allison Fowler is a 50 y.o. female patient admitted with "I'm a binge drinker".  HPI:  Patient interviewed. Chart reviewed. Spoke with her mother as well. Labs and vitals reviewed. 50 year old woman came to the hospital requesting help with alcohol withdrawal. She has been admitted to the medical service. Patient tells me that she will binge drink and that most recently for about 8 days she had been drinking as much as she could. During that time she eats very poorly. Her mood becomes more depressed and down. She will also go through equally long periods of time where she will be able to stop drinking but keeps relapsing. Denies that she is abusing any other drugs. Mood overall feels frustrated anxious and down. Sleep is often poor and interrupted. Currently appetite is poor. Patient denies any suicidal thoughts. She is not having any visual hallucinations today and denies any psychotic symptoms. She is currently going to [place] and seeing a psychiatrist but is only taking gabapentin for mild anxiety. She is not engaging in any outpatient substance abuse treatment program.  Social history: Patient lives with her grandmother. She works Catering manager during the day. Seems to have fairly good relationship with her family.  Medical history: Patient has no known significant ongoing medical problems. No history of high blood pressure diabetes heart  disease.  Substance abuse history: Patient says she's been struggling with alcohol problems for over 10 years. She did have a period of several years when she was able to remain completely sober. She claims that she was not engaged in any kind of treatment during that time. She is not sure what was different about it. For the last several years however she has been struggling to try to stop drinking again. She says that she has had an alcohol withdrawal seizure but it was about 12 years ago. She also claims to have had delirium tremens although I'm not sure she's had full DTs perhaps just transient visual hallucinations. Patient says she is tried some substance abuse treatment but hasn't really stuck with it. She thinks of herself as having tried alcoholics anonymous for having gone to 5 groups. Patient denies that she abuses any other drugs.  Past Psychiatric History: No history of psychiatric inpatient treatment. Sees Dr. Leonides Schanz at [place] a for anxiety. Currently just taking gabapentin. Santiago Glad remember other medicines she has taken in the past. Denies any history of suicide. Denies any history of psychotic symptoms. No inpatient hospitalization  Risk to Self: Is patient at risk for suicide?: No Risk to Others:   Prior Inpatient Therapy:   Prior Outpatient Therapy:    Past Medical History:  Past Medical History  Diagnosis Date  . Alcoholism ([place])   . Kidney stone   . Anxiety     Past Surgical History  Procedure Laterality Date  . Lithotripsy    . Leg surgery     Family History:  Family History  Problem Relation Age of Onset  . Cancer Father   .  Alcohol abuse Father    Family Psychiatric  History: Patient's father had an alcohol abuse problem and also a history of depression Social History:  History  Alcohol Use  . 2.4 oz/week  . 4 Standard drinks or equivalent per week     History  Drug Use No    Social History   Social History  . Marital Status: Single    Spouse Name: N/A  .  Number of Children: N/A  . Years of Education: N/A   Social History Main Topics  . Smoking status: Current Every Day Smoker -- 0.50 packs/day    Types: Cigarettes  . Smokeless tobacco: None  . Alcohol Use: 2.4 oz/week    4 Standard drinks or equivalent per week  . Drug Use: No  . Sexual Activity: Not Asked   Other Topics Concern  . None   Social History Narrative   Additional Social History:                          Allergies:  No Known Allergies  Labs:  Results for orders placed or performed during the hospital encounter of 01/03/16 (from the past 48 hour(s))  CBC with Differential     Status: None   Collection Time: 01/03/16  6:17 PM  Result Value Ref Range   WBC 7.0 3.6 - 11.0 K/uL   RBC 4.52 3.80 - 5.20 MIL/uL   Hemoglobin 14.3 12.0 - 16.0 g/dL   HCT 41.5 35.0 - 47.0 %   MCV 91.8 80.0 - 100.0 fL   MCH 31.6 26.0 - 34.0 pg   MCHC 34.4 32.0 - 36.0 g/dL   RDW 13.7 11.5 - 14.5 %   Platelets 177 150 - 440 K/uL   Neutrophils Relative % 74 %   Neutro Abs 5.2 1.4 - 6.5 K/uL   Lymphocytes Relative 18 %   Lymphs Abs 1.3 1.0 - 3.6 K/uL   Monocytes Relative 7 %   Monocytes Absolute 0.5 0.2 - 0.9 K/uL   Eosinophils Relative 1 %   Eosinophils Absolute 0.1 0 - 0.7 K/uL   Basophils Relative 0 %   Basophils Absolute 0.0 0 - 0.1 K/uL  Comprehensive metabolic panel     Status: Abnormal   Collection Time: 01/03/16  6:17 PM  Result Value Ref Range   Sodium 122 (L) 135 - 145 mmol/L   Potassium 3.6 3.5 - 5.1 mmol/L   Chloride 87 (L) 101 - 111 mmol/L   CO2 19 (L) 22 - 32 mmol/L   Glucose, Bld 82 65 - 99 mg/dL   BUN 9 6 - 20 mg/dL   Creatinine, Ser 0.80 0.44 - 1.00 mg/dL   Calcium 8.1 (L) 8.9 - 10.3 mg/dL   Total Protein 7.5 6.5 - 8.1 g/dL   Albumin 4.1 3.5 - 5.0 g/dL   AST 79 (H) 15 - 41 U/L   ALT 60 (H) 14 - 54 U/L   Alkaline Phosphatase 104 38 - 126 U/L   Total Bilirubin 1.7 (H) 0.3 - 1.2 mg/dL   GFR calc non Af Amer >60 >60 mL/min   GFR calc Af Amer >60 >60  mL/min    Comment: (NOTE) The eGFR has been calculated using the CKD EPI equation. This calculation has not been validated in all clinical situations. eGFR's persistently <60 mL/min signify possible Chronic Kidney Disease.    Anion gap 16 (H) 5 - 15  Ethanol     Status: None   Collection  Time: 01/03/16  6:17 PM  Result Value Ref Range   Alcohol, Ethyl (B) <5 <5 mg/dL    Comment:        LOWEST DETECTABLE LIMIT FOR SERUM ALCOHOL IS 5 mg/dL FOR MEDICAL PURPOSES ONLY   Acetaminophen level     Status: Abnormal   Collection Time: 01/03/16  6:17 PM  Result Value Ref Range   Acetaminophen (Tylenol), Serum <10 (L) 10 - 30 ug/mL    Comment:        THERAPEUTIC CONCENTRATIONS VARY SIGNIFICANTLY. A RANGE OF 10-30 ug/mL MAY BE AN EFFECTIVE CONCENTRATION FOR MANY PATIENTS. HOWEVER, SOME ARE BEST TREATED AT CONCENTRATIONS OUTSIDE THIS RANGE. ACETAMINOPHEN CONCENTRATIONS >150 ug/mL AT 4 HOURS AFTER INGESTION AND >50 ug/mL AT 12 HOURS AFTER INGESTION ARE OFTEN ASSOCIATED WITH TOXIC REACTIONS.   Salicylate level     Status: None   Collection Time: 01/03/16  6:17 PM  Result Value Ref Range   Salicylate Lvl <1.8 2.8 - 30.0 mg/dL  Urine Drug Screen, Qualitative ([facility] only)     Status: None   Collection Time: 01/03/16  6:17 PM  Result Value Ref Range   Tricyclic, Ur Screen NONE DETECTED NONE DETECTED   Amphetamines, Ur Screen NONE DETECTED NONE DETECTED   MDMA (Ecstasy)Ur Screen NONE DETECTED NONE DETECTED   Cocaine Metabolite,Ur [place] NONE DETECTED NONE DETECTED   Opiate, Ur Screen NONE DETECTED NONE DETECTED   Phencyclidine (PCP) Ur S NONE DETECTED NONE DETECTED   Cannabinoid 50 Ng, Ur  NONE DETECTED NONE DETECTED   Barbiturates, Ur Screen NONE DETECTED NONE DETECTED   Benzodiazepine, Ur Scrn NONE DETECTED NONE DETECTED   Methadone Scn, Ur NONE DETECTED NONE DETECTED    Comment: (NOTE) 299  Tricyclics, urine               Cutoff 1000 ng/mL 200  Amphetamines, urine             Cutoff  1000 ng/mL 300  MDMA (Ecstasy), urine           Cutoff 500 ng/mL 400  Cocaine Metabolite, urine       Cutoff 300 ng/mL 500  Opiate, urine                   Cutoff 300 ng/mL 600  Phencyclidine (PCP), urine      Cutoff 25 ng/mL 700  Cannabinoid, urine              Cutoff 50 ng/mL 800  Barbiturates, urine             Cutoff 200 ng/mL 900  Benzodiazepine, urine           Cutoff 200 ng/mL 1000 Methadone, urine                Cutoff 300 ng/mL 1100 1200 The urine drug screen provides only a preliminary, unconfirmed 1300 analytical test result and should not be used for non-medical 1400 purposes. Clinical consideration and professional judgment should 1500 be applied to any positive drug screen result due to possible 1600 interfering substances. A more specific alternate chemical method 1700 must be used in order to obtain a confirmed analytical result.  1800 Gas chromato graphy / mass spectrometry (GC/MS) is the preferred 1900 confirmatory method.   CBC     Status: None   Collection Time: 01/04/16  5:49 AM  Result Value Ref Range   WBC 4.7 3.6 - 11.0 K/uL   RBC 4.67 3.80 - 5.20 MIL/uL   Hemoglobin  14.4 12.0 - 16.0 g/dL   HCT 82.0 99.0 - 68.9 %   MCV 91.2 80.0 - 100.0 fL   MCH 30.8 26.0 - 34.0 pg   MCHC 33.7 32.0 - 36.0 g/dL   RDW 34.0 68.4 - 03.3 %   Platelets 150 150 - 440 K/uL  Basic metabolic panel     Status: Abnormal   Collection Time: 01/04/16  5:49 AM  Result Value Ref Range   Sodium 139 135 - 145 mmol/L   Potassium 3.4 (L) 3.5 - 5.1 mmol/L   Chloride 105 101 - 111 mmol/L   CO2 26 22 - 32 mmol/L   Glucose, Bld 123 (H) 65 - 99 mg/dL   BUN 7 6 - 20 mg/dL   Creatinine, Ser 5.33 0.44 - 1.00 mg/dL   Calcium 8.4 (L) 8.9 - 10.3 mg/dL   GFR calc non Af Amer >60 >60 mL/min   GFR calc Af Amer >60 >60 mL/min    Comment: (NOTE) The eGFR has been calculated using the CKD EPI equation. This calculation has not been validated in all clinical situations. eGFR's persistently <60 mL/min  signify possible Chronic Kidney Disease.    Anion gap 8 5 - 15    Current Facility-Administered Medications  Medication Dose Route Frequency Provider Last Rate Last Dose  . 0.9 %  sodium chloride infusion   Intravenous Continuous Auburn Bilberry, MD 75 mL/hr at 01/04/16 1727    . acetaminophen (TYLENOL) tablet 650 mg  650 mg Oral Q6H PRN Auburn Bilberry, MD       Or  . acetaminophen (TYLENOL) suppository 650 mg  650 mg Rectal Q6H PRN Auburn Bilberry, MD      . enoxaparin (LOVENOX) injection 40 mg  40 mg Subcutaneous Q24H Auburn Bilberry, MD   40 mg at 01/03/16 2030  . folic acid (FOLVITE) tablet 1 mg  1 mg Oral Daily Auburn Bilberry, MD   1 mg at 01/04/16 1352  . gabapentin (NEURONTIN) capsule 300 mg  300 mg Oral TID Audery Amel, MD      . loperamide (IMODIUM) capsule 2 mg  2 mg Oral PRN Oralia Manis, MD   2 mg at 01/04/16 1752  . LORazepam (ATIVAN) tablet 1 mg  1 mg Oral Q6H PRN Auburn Bilberry, MD       Or  . LORazepam (ATIVAN) injection 1 mg  1 mg Intravenous Q6H PRN Auburn Bilberry, MD      . multivitamin with minerals tablet 1 tablet  1 tablet Oral Daily Auburn Bilberry, MD   1 tablet at 01/04/16 1352  . naltrexone (DEPADE) tablet 25 mg  25 mg Oral Daily Audery Amel, MD      . nicotine (NICODERM CQ - dosed in mg/24 hours) patch 21 mg  21 mg Transdermal Daily Auburn Bilberry, MD   21 mg at 01/04/16 0900  . ondansetron (ZOFRAN) tablet 4 mg  4 mg Oral Q6H PRN Auburn Bilberry, MD       Or  . ondansetron (ZOFRAN) injection 4 mg  4 mg Intravenous Q6H PRN Auburn Bilberry, MD      . sodium chloride flush (NS) 0.9 % injection 3 mL  3 mL Intravenous Q12H Auburn Bilberry, MD   3 mL at 01/04/16 0859  . thiamine (VITAMIN B-1) tablet 100 mg  100 mg Oral Daily Auburn Bilberry, MD   100 mg at 01/04/16 1353   Or  . thiamine (B-1) injection 100 mg  100 mg Intravenous Daily Auburn Bilberry, MD  Musculoskeletal: Strength & Muscle Tone: within normal limits Gait & Station: normal Patient leans:  N/A  Psychiatric Specialty Exam: Review of Systems  Constitutional: Negative.   HENT: Negative.   Eyes: Negative.   Respiratory: Negative.   Cardiovascular: Negative.   Gastrointestinal: Positive for nausea.  Musculoskeletal: Negative.   Skin: Negative.   Neurological: Positive for tremors.  Psychiatric/Behavioral: Positive for substance abuse. Negative for depression, suicidal ideas, hallucinations and memory loss. The patient is nervous/anxious and has insomnia.     Blood pressure 132/63, pulse 96, temperature 98.8 F (37.1 C), temperature source Oral, resp. rate 20, height '5\' 5"'$  (1.651 m), weight 79.379 kg (175 lb), last menstrual period 12/20/2015, SpO2 98 %.Body mass index is 29.12 kg/(m^2).  General Appearance: Casual  Eye Contact::  Fair  Speech:  Slow  Volume:  Decreased  Mood:  Anxious  Affect:  Constricted  Thought Process:  Goal Directed  Orientation:  Full (Time, Place, and Person)  Thought Content:  Negative  Suicidal Thoughts:  No  Homicidal Thoughts:  No  Memory:  Immediate;   Good Recent;   Good Remote;   Fair  Judgement:  Intact  Insight:  Fair  Psychomotor Activity:  Tremor  Concentration:  Fair  Recall:  [employer] of Knowledge:Fair  Language: Fair  Akathisia:  No  Handed:  Right  AIMS (if indicated):     Assets:  Communication Skills Desire for Improvement Financial Resources/Insurance Housing Physical Health Resilience Social Support  ADL's:  Intact  Cognition: WNL  Sleep:      Treatment Plan Summary: Daily contact with patient to assess and evaluate symptoms and progress in treatment, Medication management and Plan 50 year old woman with alcohol abuse. Some anxiety symptoms but not full-blown depression. Patient appears motivated for treatment. As far as withdrawal she appears to be doing well right now. No sign of acute delirium. Recommend continuing the usual CIWA protocol. As far as longer term treatment we talked about the nature of  substance abuse treatment. Encouraged her in her plan to try and maintain sobriety. I did recommend that she at least consider naltrexone. We discussed it and she eventually agreed so we will start with 25 mg of naltrexone starting tomorrow. Patient already goes to [place] and can follow-up with the intensive outpatient program there. No change to other psychiatric medicine but I'm restarting her gabapentin. I will follow-up as needed.  Disposition: Supportive therapy provided about ongoing stressors. Refer to IOP.  Vitalia Stough 01/04/2016 6:41 PM

## 2016-01-05 LAB — BASIC METABOLIC PANEL
ANION GAP: 7 (ref 5–15)
BUN: <5 mg/dL — ABNORMAL LOW (ref 6–20)
CALCIUM: 8.1 mg/dL — AB (ref 8.9–10.3)
CO2: 25 mmol/L (ref 22–32)
Chloride: 108 mmol/L (ref 101–111)
Creatinine, Ser: 0.71 mg/dL (ref 0.44–1.00)
GLUCOSE: 88 mg/dL (ref 65–99)
Potassium: 3.3 mmol/L — ABNORMAL LOW (ref 3.5–5.1)
Sodium: 140 mmol/L (ref 135–145)

## 2016-01-05 LAB — MAGNESIUM: Magnesium: 1.5 mg/dL — ABNORMAL LOW (ref 1.7–2.4)

## 2016-01-05 MED ORDER — NALTREXONE HCL 50 MG PO TABS: 50.0000 mg | ORAL_TABLET | Freq: Every day | ORAL | Status: AC

## 2016-01-05 MED ORDER — NALTREXONE HCL 50 MG PO TABS
50.0000 mg | ORAL_TABLET | Freq: Every day | ORAL | Status: DC
Start: 2016-01-05 — End: 2016-01-05

## 2016-01-05 MED ORDER — POTASSIUM CHLORIDE CRYS ER 20 MEQ PO TBCR
60.0000 meq | EXTENDED_RELEASE_TABLET | Freq: Once | ORAL | Status: AC
  Administered 2016-01-05: 60 meq via ORAL
  Filled 2016-01-05: qty 3

## 2016-01-05 NOTE — Discharge Instructions (Signed)
Alcohol Withdrawal  When a person who drinks a lot of alcohol stops drinking, he or she may go through alcohol withdrawal. Alcohol withdrawal causes problems. It can make you feel:  · Tired (fatigued).  · Sad (depressed).  · Fearful (anxious).  · Grouchy (irritable).  · Not hungry.  · Sick to your stomach (nauseous).  · Shaky.  It can also make you have:  · Nightmares.  · Trouble sleeping.  · Trouble thinking clearly.  · Mood swings.  · Clammy skin.  · Very bad sweating.  · A very fast heartbeat.  · Shaking that you cannot control (tremor).  · Having a fever.  · A fit of movements that you cannot control (seizure).  · Confusion.  · Throwing up (vomiting).  · Feeling or seeing things that are not there (hallucinations).  HOME CARE  · Take medicines and vitamins only as told by your doctor.  · Do not drink alcohol.  · Have someone around in case you need help.  · Drink enough fluid to keep your pee (urine) clear or pale yellow.  · Think about joining a group to help you stop drinking.  GET HELP IF:  · Your problems get worse.  · Your problems do not go away.  · You cannot eat or drink without throwing up.  · You are having a hard time not drinking alcohol.  · You cannot stop drinking alcohol.  GET HELP RIGHT AWAY IF:  · You feel your heart beating differently than usual.  · Your chest hurts.  · You have trouble breathing.  · You have very bad problems, like:    A fever.    A fit of movements that you cannot control.    Being very confused.    Feeling or seeing things that are not there.     This information is not intended to replace advice given to you by your health care provider. Make sure you discuss any questions you have with your health care provider.     Document Released: 05/13/2008 Document Revised: 12/16/2014 Document Reviewed: 09/13/2014  Elsevier Interactive Patient Education ©2016 [employer] Inc[employer]

## 2016-01-05 NOTE — Consult Note (Signed)
  Psychiatry: Follow-up for this 50 year old woman in the hospital for withdrawal from alcohol. Patient is being depended for discharge today. Vital signs stable. Physiologically seems to be stable. No signs of delirium. Patient remains anxious but is not voicing suicidal ideation no signs of psychosis. Patient was started on naltrexone as a medication to assist with alcohol dependence. She was educated about potential side effects and the fact that this is not a guarantee fixed but rather 1 of many tools that can be employed to help with sobriety. Patient was started on 25 mg a day and has tolerated it. Usual outpatient adult dose is 50 mg a day. Prescription written for 50 mg once a day 30 day supply and no refills. Paper prescription given to patient as she does not currently have a preferred pharmacy. Patient is to follow-up with [facility] in the community.  No change to diagnosis alcohol withdrawal substance-induced mood disorder. Supportive counseling completed no other change to medicine except for the increased dose of naltrexone.

## 2016-01-05 NOTE — Discharge Summary (Signed)
Allison Fowler, 50 y.o., DOB 08-04-1966, MRN [id number]. Admission date: 01/03/2016 Discharge Date 01/05/2016 Primary MD No primary care provider on file. Admitting Physician Auburn Bilberry, MD  Admission Diagnosis  Hyponatremia [E87.1] Alcohol withdrawal, with unspecified complication [facility]) [F10.239]  Discharge Diagnosis   Principal Problem:   Alcohol abuse Active Problems:   Alcohol withdrawal (H[facility])   Substance induced mood disorder ([facility]) hyponatremia due to dehydration hypokalmia       Hospital Course Allison Fowler is a 50 y.o. female with a known history of binge driking with Alchol   and anxiety . She has been drinking for  8 days continuously. Large amount of beer. She stopped drinking yesterday and has been tremulous and anxious. Therefore came to the ER. In the ER she received a few doses of Ativan and were asked to admit the patient for alcohol withdrawal. Patient was placed on CIWA protocol. Patient was also seen by psychiatry she was provided with resources for substance abuse counseling. The patient no longer has signs and symptoms of withdrawal and stable for discharge.          Consults  None  Significant Tests:  See full reports for all details      No results found.     Today   Subjective:   Allison Fowler  feels better denies any complaints  Objective:   Blood pressure 151/85, pulse 100, temperature 98.4 F (36.9 C), temperature source Oral, resp. rate 19, height  (1.651 m), weight 79.379 kg (175 lb), last menstrual period 12/20/2015, SpO2 99 %.  .  Intake/Output Summary (Last 24 hours) at 01/05/16 1456 Last data filed at 01/05/16 1339  Gross per 24 hour  Intake 1642.5 ml  Output      0 ml  Net 1642.5 ml    Exam VITAL SIGNS: Blood pressure 151/85, pulse 100, temperature 98.4 F (36.9 C), temperature source Oral, resp. rate 19, height  (1.651 m), weight 79.379 kg (175 lb), last menstrual period 12/20/2015, SpO2 99 %.  GENERAL:   50 y.o.-year-old patient lying in the bed with no acute distress.  EYES: Pupils equal, round, reactive to light and accommodation. No scleral icterus. Extraocular muscles intact.  HEENT: Head atraumatic, normocephalic. Oropharynx and nasopharynx clear.  NECK:  Supple, no jugular venous distention. No thyroid enlargement, no tenderness.  LUNGS: Normal breath sounds bilaterally, no wheezing, rales,rhonchi or crepitation. No use of accessory muscles of respiration.  CARDIOVASCULAR: S1, S2 normal. No murmurs, rubs, or gallops.  ABDOMEN: Soft, nontender, nondistended. Bowel sounds present. No organomegaly or mass.  EXTREMITIES: No pedal edema, cyanosis, or clubbing.  NEUROLOGIC: Cranial nerves II through XII are intact. Muscle strength 5/5 in all extremities. Sensation intact. Gait not checked.  PSYCHIATRIC: The patient is alert and oriented x 3.  SKIN: No obvious rash, lesion, or ulcer.   Data Review     CBC w Diff: Lab Results  Component Value Date   WBC 4.7 01/04/2016   HGB 14.4 01/04/2016   HCT 42.6 01/04/2016   PLT 150 01/04/2016   LYMPHOPCT 18 01/03/2016   MONOPCT 7 01/03/2016   EOSPCT 1 01/03/2016   BASOPCT 0 01/03/2016   CMP: Lab Results  Component Value Date   NA 140 01/05/2016   K 3.3* 01/05/2016   CL 108 01/05/2016   CO2 25 01/05/2016   BUN <5* 01/05/2016   CREATININE 0.71 01/05/2016   PROT 7.5 01/03/2016   ALBUMIN 4.1 01/03/2016   BILITOT 1.7* 01/03/2016   ALKPHOS 104 01/03/2016  AST 79* 01/03/2016   ALT 60* 01/03/2016  .  Micro Results No results found for this or any previous visit (from the past 240 hour(s)).      Code Status Orders        Start     Ordered   01/03/16 2016  Full code   Continuous     01/03/16 2016    Code Status History    Date Active Date Inactive Code Status Order ID Comments User Context   This patient has a current code status but no historical code status.            Discharge Medications     Medication List     STOP taking these medications        benzonatate 200 MG capsule  Commonly known as:  TESSALON     chlorpheniramine-HYDROcodone 10-8 MG/5ML [place]  Commonly known as:  TUSSIONEX PENNKINETIC ER      TAKE these medications        gabapentin 100 MG capsule  Commonly known as:  NEURONTIN  Take 100 mg by mouth 3 (three) times daily.     naltrexone 50 MG tablet  Commonly known as:  DEPADE  Take 1 tablet (50 mg total) by mouth daily.           Total Time in preparing paper work, data evaluation and todays exam - 35 minutes  Auburn Bilberry M.D on 01/05/2016 at 2:56 PM  [facility]   Office  [phone]

## 2016-01-05 NOTE — Progress Notes (Signed)
Prescription sent to pharmacy per Dr. Toni Amend - see previous note. Patient discharged home via wheelchair by nursing staff. Bo Mcclintock, RN

## 2016-01-05 NOTE — Progress Notes (Signed)
Discharge instructions given and went over with patient and mother at bedside. Patient questioning a medication Dr. Toni Amend discussed and wanting to see if a prescription could be given for naltrexone. Awaiting call back from psychiatrist. Bo Mcclintock, RN

## 2016-05-02 ENCOUNTER — Emergency Department
Admission: EM | Admit: 2016-05-02 | Discharge: 2016-05-02 | Disposition: A | Discharge status: Home / Self Care | Payer: 59 | Attending: Emergency Medicine | Admitting: Emergency Medicine

## 2016-05-02 DIAGNOSIS — Z79899 Other long term (current) drug therapy: Secondary | ICD-10-CM | POA: Insufficient documentation

## 2016-05-02 DIAGNOSIS — F1010 Alcohol abuse, uncomplicated: Secondary | ICD-10-CM | POA: Insufficient documentation

## 2016-05-02 DIAGNOSIS — F17210 Nicotine dependence, cigarettes, uncomplicated: Secondary | ICD-10-CM | POA: Insufficient documentation

## 2016-05-02 LAB — COMPREHENSIVE METABOLIC PANEL
ALK PHOS: 80 U/L (ref 38–126)
ALT: 84 U/L — AB (ref 14–54)
AST: 111 U/L — ABNORMAL HIGH (ref 15–41)
Albumin: 4.3 g/dL (ref 3.5–5.0)
Anion gap: 20 — ABNORMAL HIGH (ref 5–15)
BUN: 8 mg/dL (ref 6–20)
CALCIUM: 8.2 mg/dL — AB (ref 8.9–10.3)
CO2: 18 mmol/L — ABNORMAL LOW (ref 22–32)
CREATININE: 0.79 mg/dL (ref 0.44–1.00)
Chloride: 96 mmol/L — ABNORMAL LOW (ref 101–111)
Glucose, Bld: 92 mg/dL (ref 65–99)
Potassium: 3.6 mmol/L (ref 3.5–5.1)
Sodium: 134 mmol/L — ABNORMAL LOW (ref 135–145)
TOTAL PROTEIN: 7.5 g/dL (ref 6.5–8.1)
Total Bilirubin: 0.7 mg/dL (ref 0.3–1.2)

## 2016-05-02 LAB — URINE DRUG SCREEN, QUALITATIVE (ARMC ONLY)
AMPHETAMINES, UR SCREEN: NOT DETECTED
BARBITURATES, UR SCREEN: NOT DETECTED
BENZODIAZEPINE, UR SCRN: NOT DETECTED
COCAINE METABOLITE, UR ~~LOC~~: NOT DETECTED
Cannabinoid 50 Ng, Ur ~~LOC~~: NOT DETECTED
MDMA (Ecstasy)Ur Screen: NOT DETECTED
Methadone Scn, Ur: NOT DETECTED
OPIATE, UR SCREEN: NOT DETECTED
PHENCYCLIDINE (PCP) UR S: NOT DETECTED
Tricyclic, Ur Screen: NOT DETECTED

## 2016-05-02 LAB — LIPASE, BLOOD: Lipase: 68 U/L — ABNORMAL HIGH (ref 11–51)

## 2016-05-02 LAB — URINALYSIS COMPLETE WITH MICROSCOPIC (ARMC ONLY)
BILIRUBIN URINE: NEGATIVE
Glucose, UA: NEGATIVE mg/dL
Leukocytes, UA: NEGATIVE
Nitrite: NEGATIVE
PH: 5 (ref 5.0–8.0)
Protein, ur: 30 mg/dL — AB
SPECIFIC GRAVITY, URINE: 1.009 (ref 1.005–1.030)

## 2016-05-02 LAB — ETHANOL
ALCOHOL ETHYL (B): 232 mg/dL — AB (ref <5)
Alcohol, Ethyl (B): 118 mg/dL — ABNORMAL HIGH (ref <5)

## 2016-05-02 LAB — CBC
HCT: 46.4 % (ref 35.0–47.0)
Hemoglobin: 15.9 g/dL (ref 12.0–16.0)
MCH: 32.4 pg (ref 26.0–34.0)
MCHC: 34.3 g/dL (ref 32.0–36.0)
MCV: 94.4 fL (ref 80.0–100.0)
Platelets: 210 10*3/uL (ref 150–440)
RBC: 4.92 MIL/uL (ref 3.80–5.20)
RDW: 13.4 % (ref 11.5–14.5)
WBC: 6 10*3/uL (ref 3.6–11.0)

## 2016-05-02 LAB — POCT PREGNANCY, URINE: Preg Test, Ur: NEGATIVE

## 2016-05-02 MED ORDER — LORAZEPAM 1 MG PO TABS
1.0000 mg | ORAL_TABLET | Freq: Once | ORAL | Status: AC
  Administered 2016-05-02: 1 mg via ORAL

## 2016-05-02 MED ORDER — ONDANSETRON 4 MG PO TBDP
ORAL_TABLET | ORAL | Status: AC
  Administered 2016-05-02: 4 mg via ORAL
  Filled 2016-05-02: qty 1

## 2016-05-02 MED ORDER — ONDANSETRON 4 MG PO TBDP
4.0000 mg | ORAL_TABLET | Freq: Once | ORAL | Status: AC
  Administered 2016-05-02: 4 mg via ORAL

## 2016-05-02 MED ORDER — LORAZEPAM 1 MG PO TABS
ORAL_TABLET | ORAL | Status: AC
  Administered 2016-05-02: 1 mg via ORAL
  Filled 2016-05-02: qty 1

## 2016-05-02 NOTE — BH Assessment (Signed)
Updated labs faxed to [facility]. Waiting on call back for time to pick up.

## 2016-05-02 NOTE — ED Notes (Signed)
Signature page is not working at this time

## 2016-05-02 NOTE — ED Provider Notes (Signed)
[facility] Emergency Department Provider Note   ____________________________________________  Time seen: Approximately 10:51 AM  I have reviewed the triage vital signs and the nursing notes.   HISTORY  Chief Complaint Medical Clearance    HPI Allison Fowler is a 50 y.o. female patient wishes detox from alcohol. She reports she's been drinking almost a fifth a day. She's been doing this for about 9 days. She has had DTs and seizures in the past with withdrawal. She is feeling little bit shaky now and her last drink was this morning.   Past Medical History  Diagnosis Date  . Alcoholism (HCC)   . Kidney stone   . Anxiety     Patient Active Problem List   Diagnosis Date Noted  . Substance induced mood disorder (HCC) 01/04/2016  . Alcohol abuse 01/04/2016  . Alcohol withdrawal (HCC) 01/03/2016    Past Surgical History  Procedure Laterality Date  . Lithotripsy    . Leg surgery      Current Outpatient Rx  Name  Route  Sig  Dispense  Refill  . gabapentin (NEURONTIN) 100 MG capsule   Oral   Take 100 mg by mouth 3 (three) times daily.         . naltrexone (DEPADE) 50 MG tablet   Oral   Take 1 tablet (50 mg total) by mouth daily.   30 tablet   0     Allergies Review of patient's allergies indicates no known allergies.  Family History  Problem Relation Age of Onset  . Cancer Father   . Alcohol abuse Father     Social History Social History  Substance Use Topics  . Smoking status: Current Every Day Smoker -- 0.50 packs/day    Types: Cigarettes  . Smokeless tobacco: Not on file  . Alcohol Use: 2.4 oz/week    4 Standard drinks or equivalent per week    Review of Systems Constitutional: No fever/chills Eyes: No visual changes. ENT: No sore throat. Cardiovascular: Denies chest pain. Respiratory: Denies shortness of breath. Gastrointestinal: No abdominal pain.  No nausea, no vomiting.  No diarrhea.  No  constipation. Genitourinary: Negative for dysuria. Musculoskeletal: Negative for back pain. Skin: Negative for rash. Neurological: Negative for headaches, focal weakness or numbness.  10-point ROS otherwise negative.  ____________________________________________   PHYSICAL EXAM:  VITAL SIGNS: ED Triage Vitals  Enc Vitals Group     BP 05/02/16 0937 143/81 mmHg     Pulse Rate 05/02/16 0937 104     Resp 05/02/16 0937 18     Temp 05/02/16 0937 98.1 F (36.7 C)     Temp Source 05/02/16 0937 Oral     SpO2 05/02/16 0937 97 %     Weight 05/02/16 0937 180 lb (81.647 kg)     Height 05/02/16 0937 5\' 5"  (1.651 m)     Head Cir --      Peak Flow --      Pain Score 05/02/16 0951 8     Pain Loc --      Pain Edu? --      Excl. in GC? --    Constitutional: Alert and oriented. Well appearing and in no acute distress. Eyes: Conjunctivae are normal. PERRL. EOMI. Head: Atraumatic. Nose: No congestion/rhinnorhea. Mouth/Throat: Mucous membranes are moist.  Oropharynx non-erythematous. Neck: No stridor. Cardiovascular: Normal rate, regular rhythm. Grossly normal heart sounds.  Good peripheral circulation. Respiratory: Normal respiratory effort.  No retractions. Lungs CTAB. Gastrointestinal: Soft and nontender. No distention.  No abdominal bruits. No CVA tenderness. Musculoskeletal: No lower extremity tenderness nor edema.  No joint effusions. Neurologic:  Normal speech and language. No gross focal neurologic deficits are appreciated. No gait instability. Skin:  Skin is warm, dry and intact. No rash noted.   ____________________________________________   LABS (all labs ordered are listed, but only abnormal results are displayed)  Labs Reviewed  COMPREHENSIVE METABOLIC PANEL - Abnormal; Notable for the following:    Sodium 134 (*)    Chloride 96 (*)    CO2 18 (*)    Calcium 8.2 (*)    AST 111 (*)    ALT 84 (*)    Anion gap 20 (*)    All other components within normal limits  ETHANOL  - Abnormal; Notable for the following:    Alcohol, Ethyl (B) 232 (*)    All other components within normal limits  LIPASE, BLOOD - Abnormal; Notable for the following:    Lipase 68 (*)    All other components within normal limits  URINALYSIS COMPLETEWITH MICROSCOPIC ([facility] ONLY) - Abnormal; Notable for the following:    Color, Urine YELLOW (*)    APPearance HAZY (*)    Ketones, ur 2+ (*)    Hgb urine dipstick 2+ (*)    Protein, ur 30 (*)    Bacteria, UA FEW (*)    Squamous Epithelial / LPF 0-5 (*)    All other components within normal limits  ETHANOL - Abnormal; Notable for the following:    Alcohol, Ethyl (B) 118 (*)    All other components within normal limits  CBC  URINE DRUG SCREEN, QUALITATIVE ([facility] ONLY)  POCT PREGNANCY, URINE  POC URINE PREG, ED   ____________________________________________  EKG   ____________________________________________  RADIOLOGY   ____________________________________________   PROCEDURES    ____________________________________________   INITIAL IMPRESSION / ASSESSMENT AND PLAN / ED COURSE  Pertinent labs & imaging results that were available during my care of the patient were reviewed by me and considered in my medical decision making (see chart for details).   ____________________________________________   FINAL CLINICAL IMPRESSION(S) / ED DIAGNOSES  Final diagnoses:  Alcohol abuse      NEW MEDICATIONS STARTED DURING THIS VISIT:  New Prescriptions   No medications on file     Note:  This document was prepared using Dragon voice recognition software and may include unintentional dictation errors.    Arnaldo Natal, MD 05/02/16 [phone]

## 2016-05-02 NOTE — ED Notes (Signed)
BEHAVIORAL HEALTH ROUNDING Patient sleeping: No. Patient alert and oriented: yes Behavior appropriate: Yes.  ; If no, describe:  Nutrition and fluids offered: yes Toileting and hygiene offered: Yes  Sitter present: q15 minute observations and security  monitoring Law enforcement present: Yes  ODS  

## 2016-05-02 NOTE — ED Notes (Signed)
Awaiting etoh results - pt to transfer to [facility]

## 2016-05-02 NOTE — ED Provider Notes (Signed)
Progress note  5:37 PM 05/02/16 Pt will be discharged to [facility] and has been medically cleared.  Leona CarryLinda M Treon Kehl, MD 05/02/16 [phone]

## 2016-05-02 NOTE — BH Assessment (Signed)
Writer discussed with the patient about the option of [facility]. Patient was in the agreement with the plan. Patient signed the Release of information, in order to send labs and ER Notes. Information sent sent to [facility] via fax and confirmed it was received ([phone]).

## 2016-05-02 NOTE — ED Notes (Signed)
Pt arrives here via [facility] from [place]  Pt reports that she has been drinking beer this am and that she wants detox   She states that she has been drinking heavy for the last 7-8 days straight   She drinks beer and liquor   Hx:  Anxiety

## 2016-05-02 NOTE — ED Notes (Signed)

## 2016-05-02 NOTE — BH Assessment (Signed)
Received phone call [facility] ([phone]), they will pick the patient up within the next hour. Writer updated ER MD (Dr. Ladona Ridgelaylor) and patient's nurse (Amy T.).

## 2016-05-02 NOTE — BH Assessment (Signed)
Assessment Note  Allison Fowler is an 50 y.o. female who presents to the ER, seeking assistance with alcohol detox. According to the patient, she has drank an half of a fifth of liquor on a daily basis for the last 9 to 10 days. Upon arrival to the ER her BAC was 232. Current symptoms are; shaking, nausea, anxiety and decrease sleep. She reports of having one seizure, approximately 3 years ago. It was when she was having withdrawals from her alcohol use. She denies having a seizure disorder. She also denies having a history of blackouts and the use of any other mind altering substances.  Past treatment includes; [facility] and [facility]. She was with [facility], in their SAIOP.  [facility] was for alcohol detox and treatment.  She have no involvement with the legal system, no history of violence or aggression.  She denies SI/HI and AV/H.   Diagnosis: Alcohol Use Disorder; Severe  Past Medical History:  Past Medical History  Diagnosis Date  . Alcoholism (HCC)   . Kidney stone   . Anxiety     Past Surgical History  Procedure Laterality Date  . Lithotripsy    . Leg surgery      Family History:  Family History  Problem Relation Age of Onset  . Cancer Father   . Alcohol abuse Father     Social History:  reports that she has been smoking Cigarettes.  She has been smoking about 0.50 packs per day. She does not have any smokeless tobacco history on file. She reports that she drinks about 2.4 oz of alcohol per week. She reports that she does not use illicit drugs.  Additional Social History:  Alcohol / Drug Use Pain Medications: See PTA Prescriptions: See PTA Over the Counter: See PTA History of alcohol / drug use?: Yes Withdrawal Symptoms: Nausea / Vomiting, Fever / Chills, Tremors, Sweats Substance #1 Name of Substance 1: Alcohol 1 - Age of First Use: 17 1 - Amount (size/oz): "up to a fifth a day" 1 - Frequency: Daily 1 - Duration: "for the last 9, 10 days." 1 - Last Use / Amount:  05/02/2016  CIWA: CIWA-Ar BP: (!) 143/81 mmHg Pulse Rate: 93 Nausea and Vomiting: mild nausea with no vomiting Tactile Disturbances: none Tremor: not visible, but can be felt fingertip to fingertip Auditory Disturbances: not present Paroxysmal Sweats: barely perceptible sweating, palms moist Visual Disturbances: not present Anxiety: mildly anxious Headache, Fullness in Head: very mild Agitation: normal activity Orientation and Clouding of Sensorium: oriented and can do serial additions CIWA-Ar Total: 5 COWS:    Allergies: No Known Allergies  Home Medications:  (Not in a hospital admission)  OB/GYN Status:  No LMP recorded.  General Assessment Data Location of Assessment: [facility] ED [facility] Assessment: In system Is this a Tele or Face-to-Face Assessment?: Face-to-Face Is this an Initial Assessment or a Re-assessment for this encounter?: Initial Assessment Marital status: Single Maiden name: n/a Is patient pregnant?: No Pregnancy Status: No Living Arrangements: Other relatives (Grandmother lives with her) Can pt return to current living arrangement?: Yes Admission Status: Voluntary Is patient capable of signing voluntary admission?: Yes Referral Source: Self/Family/Friend Insurance type: None  Medical Screening Exam [facility] Walk-in ONLY) Medical Exam completed: Yes  Crisis Care Plan Living Arrangements: Other relatives (Grandmother lives with her) Legal Guardian: Other: (Reports of none) Name of Psychiatrist: Reports of none Name of Therapist: Reports of none  Education Status Is patient currently in school?: No Current Grade: n/a Highest grade of  school patient has completed: 12th Grade  Name of school: n/a Contact person: n/a  Risk to self with the past 6 months Suicidal Ideation: No Has patient been a risk to self within the past 6 months prior to admission? : No Suicidal Intent: No Has patient had any suicidal intent within the past 6 months prior to admission? :  No Is patient at risk for suicide?: No Suicidal Plan?: No Has patient had any suicidal plan within the past 6 months prior to admission? : No Access to Means: No What has been your use of drugs/alcohol within the last 12 months?: Alcohol Previous Attempts/Gestures: No How many times?: 0 Other Self Harm Risks: Active Addiction Triggers for Past Attempts: None known Intentional Self Injurious Behavior: None Family Suicide History: No Recent stressful life event(s):  (Reports of none) Persecutory voices/beliefs?: No Depression: Yes Depression Symptoms: Isolating, Tearfulness, Loss of interest in usual pleasures, Feeling angry/irritable Substance abuse history and/or treatment for substance abuse?: Yes Suicide prevention information given to non-admitted patients: Not applicable  Risk to Others within the past 6 months Homicidal Ideation: No Does patient have any lifetime risk of violence toward others beyond the six months prior to admission? : No Thoughts of Harm to Others: No Current Homicidal Intent: No Current Homicidal Plan: No Access to Homicidal Means: No Identified Victim: Reports of none History of harm to others?: No Violent Behavior Description: Reports of none Does patient have access to weapons?: No Criminal Charges Pending?: No Does patient have a court date: No Is patient on probation?: No  Psychosis Hallucinations: None noted Delusions: None noted  Mental Status Report Appearance/Hygiene: Other (Comment) (Own Clothes) Eye Contact: Good Motor Activity: Freedom of movement, Unremarkable Speech: Logical/coherent, Unremarkable Level of Consciousness: Alert Mood: Depressed, Anxious, Sad Affect: Sad, Appropriate to circumstance Anxiety Level: Minimal Thought Processes: Coherent, Relevant Judgement: Unimpaired Orientation: Person, Place, Time, Situation, Appropriate for developmental age Obsessive Compulsive Thoughts/Behaviors: Minimal  Cognitive  Functioning Concentration: Normal Memory: Recent Intact, Remote Intact IQ: Average Insight: Fair Impulse Control: Poor Appetite: Poor Weight Loss: 10 (Within 30 days) Weight Gain: 0 Sleep: Decreased (Having trouble falling alseep) Total Hours of Sleep: 4 Vegetative Symptoms: None  ADLScreening [facility] Assessment Services) Patient's cognitive ability adequate to safely complete daily activities?: Yes Patient able to express need for assistance with ADLs?: Yes Independently performs ADLs?: Yes (appropriate for developmental age)  Prior Inpatient Therapy Prior Inpatient Therapy: Yes Prior Therapy Dates: 08/2016 Prior Therapy Facilty/Provider(s): [facility] Reason for Treatment: Detox from alcohol  Prior Outpatient Therapy Prior Outpatient Therapy: Yes Prior Therapy Dates: [facility] SAIOP Prior Therapy Facilty/Provider(s): 01/2016-04/2016 Reason for Treatment: Substance Abuse Treatment Does patient have an ACCT team?: No Does patient have Intensive In-House Services?  : No Does patient have [facility] services? : No Does patient have P4CC services?: No  ADL Screening (condition at time of admission) Patient's cognitive ability adequate to safely complete daily activities?: Yes Patient able to express need for assistance with ADLs?: Yes Independently performs ADLs?: Yes (appropriate for developmental age)       Abuse/Neglect Assessment (Assessment to be complete while patient is alone) Physical Abuse: Denies Verbal Abuse: Denies Sexual Abuse: Denies Exploitation of patient/patient's resources: Denies Self-Neglect: Denies Values / Beliefs Cultural Requests During Hospitalization: None Spiritual Requests During Hospitalization: None Consults Spiritual Care Consult Needed: No Social Work Consult Needed: No Merchant navy officer (For Healthcare) Does patient have an advance directive?: No    Additional Information 1:1 In Past 12 Months?: No CIRT Risk: No Elopement  Risk: No Does  patient have medical clearance?: Yes  Child/Adolescent Assessment Running Away Risk: Denies (Patient is an adult)  Disposition:  Disposition Initial Assessment Completed for this Encounter: Yes Disposition of Patient: Referred to Patient referred to: [facility], Other (Comment) Facilities manager(Detox Facility )  On Site Evaluation by:   Reviewed with Physician:    Lilyan Gilfordalvin J. Beronica Lansdale MS, LCAS, LPC, NCC, CCSI Therapeutic Triage Specialist 05/02/2016 1:38 PM

## 2016-05-02 NOTE — ED Notes (Signed)
Pt requesting medication for detox symptoms - CIWA completed at 1300  Pt showed mild withdrawal sx - med to be administered

## 2016-09-22 ENCOUNTER — Emergency Department
Admission: EM | Admit: 2016-09-22 | Discharge: 2016-09-23 | Disposition: A | Discharge status: Home / Self Care | Payer: 59 | Attending: Emergency Medicine | Admitting: Emergency Medicine

## 2016-09-22 DIAGNOSIS — F1994 Other psychoactive substance use, unspecified with psychoactive substance-induced mood disorder: Secondary | ICD-10-CM | POA: Diagnosis present

## 2016-09-22 DIAGNOSIS — F17210 Nicotine dependence, cigarettes, uncomplicated: Secondary | ICD-10-CM | POA: Insufficient documentation

## 2016-09-22 DIAGNOSIS — IMO0002 Reserved for concepts with insufficient information to code with codable children: Secondary | ICD-10-CM

## 2016-09-22 DIAGNOSIS — E872 Acidosis: Secondary | ICD-10-CM | POA: Insufficient documentation

## 2016-09-22 DIAGNOSIS — Z79899 Other long term (current) drug therapy: Secondary | ICD-10-CM | POA: Insufficient documentation

## 2016-09-22 DIAGNOSIS — F1010 Alcohol abuse, uncomplicated: Secondary | ICD-10-CM | POA: Insufficient documentation

## 2016-09-22 DIAGNOSIS — F10939 Alcohol use, unspecified with withdrawal, unspecified: Secondary | ICD-10-CM | POA: Diagnosis present

## 2016-09-22 DIAGNOSIS — F10239 Alcohol dependence with withdrawal, unspecified: Secondary | ICD-10-CM | POA: Diagnosis present

## 2016-09-22 LAB — CBC
HCT: 57.3 % — ABNORMAL HIGH (ref 35.0–47.0)
HEMATOCRIT: 44.5 % (ref 35.0–47.0)
HEMOGLOBIN: 19.6 g/dL — AB (ref 12.0–16.0)
Hemoglobin: 15.8 g/dL (ref 12.0–16.0)
MCH: 33.2 pg (ref 26.0–34.0)
MCH: 33.2 pg (ref 26.0–34.0)
MCHC: 34.2 g/dL (ref 32.0–36.0)
MCHC: 35.4 g/dL (ref 32.0–36.0)
MCV: 97.2 fL (ref 80.0–100.0)
MCV: 93.9 fL (ref 80.0–100.0)
PLATELETS: 232 10*3/uL (ref 150–440)
Platelets: 296 10*3/uL (ref 150–440)
RBC: 5.9 MIL/uL — AB (ref 3.80–5.20)
RBC: 4.74 MIL/uL (ref 3.80–5.20)
RDW: 12.7 % (ref 11.5–14.5)
RDW: 12.8 % (ref 11.5–14.5)
WBC: 10.5 10*3/uL (ref 3.6–11.0)
WBC: 6.9 10*3/uL (ref 3.6–11.0)

## 2016-09-22 LAB — COMPREHENSIVE METABOLIC PANEL
ALT: 28 U/L (ref 14–54)
ALT: 19 U/L (ref 14–54)
ANION GAP: 22 — AB (ref 5–15)
ANION GAP: 9 (ref 5–15)
AST: 50 U/L — ABNORMAL HIGH (ref 15–41)
AST: 25 U/L (ref 15–41)
Albumin: 4.6 g/dL (ref 3.5–5.0)
Albumin: 3.6 g/dL (ref 3.5–5.0)
Alkaline Phosphatase: 117 U/L (ref 38–126)
Alkaline Phosphatase: 84 U/L (ref 38–126)
BILIRUBIN TOTAL: 1.2 mg/dL (ref 0.3–1.2)
BILIRUBIN TOTAL: 0.6 mg/dL (ref 0.3–1.2)
BUN: 13 mg/dL (ref 6–20)
BUN: 11 mg/dL (ref 6–20)
CHLORIDE: 100 mmol/L — AB (ref 101–111)
CO2: 15 mmol/L — ABNORMAL LOW (ref 22–32)
CO2: 21 mmol/L — ABNORMAL LOW (ref 22–32)
Calcium: 8.6 mg/dL — ABNORMAL LOW (ref 8.9–10.3)
Calcium: 7.9 mg/dL — ABNORMAL LOW (ref 8.9–10.3)
Chloride: 105 mmol/L (ref 101–111)
Creatinine, Ser: 1.12 mg/dL — ABNORMAL HIGH (ref 0.44–1.00)
Creatinine, Ser: 0.85 mg/dL (ref 0.44–1.00)
GFR calc Af Amer: >60 mL/min (ref >60)
GFR, EST NON AFRICAN AMERICAN: 57 mL/min — AB (ref >60)
Glucose, Bld: 54 mg/dL — ABNORMAL LOW (ref 65–99)
Glucose, Bld: 217 mg/dL — ABNORMAL HIGH (ref 65–99)
POTASSIUM: 4.3 mmol/L (ref 3.5–5.1)
POTASSIUM: 3.6 mmol/L (ref 3.5–5.1)
Sodium: 137 mmol/L (ref 135–145)
Sodium: 135 mmol/L (ref 135–145)
TOTAL PROTEIN: 8.3 g/dL — AB (ref 6.5–8.1)
TOTAL PROTEIN: 6.4 g/dL — AB (ref 6.5–8.1)

## 2016-09-22 LAB — URINALYSIS COMPLETE WITH MICROSCOPIC (ARMC ONLY)
Bilirubin Urine: NEGATIVE
GLUCOSE, UA: NEGATIVE mg/dL
LEUKOCYTES UA: NEGATIVE
NITRITE: NEGATIVE
PH: 5 (ref 5.0–8.0)
Protein, ur: 100 mg/dL — AB
Specific Gravity, Urine: 1.015 (ref 1.005–1.030)

## 2016-09-22 LAB — LACTIC ACID, PLASMA
LACTIC ACID, VENOUS: 3.4 mmol/L — AB (ref 0.5–1.9)
LACTIC ACID, VENOUS: 1 mmol/L (ref 0.5–1.9)
Lactic Acid, Venous: 2.5 mmol/L (ref 0.5–1.9)

## 2016-09-22 LAB — GLUCOSE, CAPILLARY: Glucose-Capillary: 220 mg/dL — ABNORMAL HIGH (ref 65–99)

## 2016-09-22 LAB — URINE DRUG SCREEN, QUALITATIVE (ARMC ONLY)
AMPHETAMINES, UR SCREEN: NOT DETECTED
BARBITURATES, UR SCREEN: NOT DETECTED
Benzodiazepine, Ur Scrn: NOT DETECTED
COCAINE METABOLITE, UR ~~LOC~~: NOT DETECTED
Cannabinoid 50 Ng, Ur ~~LOC~~: NOT DETECTED
MDMA (ECSTASY) UR SCREEN: NOT DETECTED
METHADONE SCREEN, URINE: NOT DETECTED
Opiate, Ur Screen: NOT DETECTED
Phencyclidine (PCP) Ur S: NOT DETECTED
TRICYCLIC, UR SCREEN: NOT DETECTED

## 2016-09-22 LAB — BASIC METABOLIC PANEL
Anion gap: 9 (ref 5–15)
BUN: 9 mg/dL (ref 6–20)
CALCIUM: 8 mg/dL — AB (ref 8.9–10.3)
CO2: 20 mmol/L — AB (ref 22–32)
CREATININE: 0.71 mg/dL (ref 0.44–1.00)
Chloride: 107 mmol/L (ref 101–111)
Glucose, Bld: 122 mg/dL — ABNORMAL HIGH (ref 65–99)
Potassium: 3.7 mmol/L (ref 3.5–5.1)
SODIUM: 136 mmol/L (ref 135–145)

## 2016-09-22 LAB — BLOOD GAS, VENOUS
Acid-base deficit: 1 mmol/L (ref 0.0–2.0)
Bicarbonate: 23.5 mmol/L (ref 20.0–28.0)
O2 SAT: 87.6 %
PATIENT TEMPERATURE: 37
PCO2 VEN: 38 mmHg — AB (ref 44.0–60.0)
pH, Ven: 7.4 (ref 7.250–7.430)
pO2, Ven: 54 mmHg — ABNORMAL HIGH (ref 32.0–45.0)

## 2016-09-22 LAB — OSMOLALITY
OSMOLALITY: 294 mosm/kg (ref 275–295)
Osmolality: 332 mOsm/kg (ref 275–295)
Osmolality: 284 mOsm/kg (ref 275–295)

## 2016-09-22 LAB — POCT PREGNANCY, URINE: PREG TEST UR: NEGATIVE

## 2016-09-22 LAB — ETHANOL
ALCOHOL ETHYL (B): 227 mg/dL — AB (ref <5)
ALCOHOL ETHYL (B): 60 mg/dL — AB (ref <5)

## 2016-09-22 LAB — ACETAMINOPHEN LEVEL

## 2016-09-22 LAB — SALICYLATE LEVEL

## 2016-09-22 MED ORDER — ALUM & MAG HYDROXIDE-SIMETH 200-200-20 MG/5ML PO SUSP
30.0000 mL | Freq: Once | ORAL | Status: AC
  Administered 2016-09-22: 30 mL via ORAL

## 2016-09-22 MED ORDER — SODIUM CHLORIDE 0.9 % IV SOLN
Freq: Once | INTRAVENOUS | Status: AC
  Administered 2016-09-22: 16:00:00 via INTRAVENOUS

## 2016-09-22 MED ORDER — LORAZEPAM 2 MG PO TABS
0.0000 mg | ORAL_TABLET | Freq: Two times a day (BID) | ORAL | Status: DC
  Administered 2016-09-22: 1 mg via ORAL

## 2016-09-22 MED ORDER — ONDANSETRON HCL 4 MG/2ML IJ SOLN
4.0000 mg | Freq: Once | INTRAMUSCULAR | Status: AC
Start: 2016-09-22 — End: 2016-09-22
  Administered 2016-09-22: 4 mg via INTRAVENOUS
  Filled 2016-09-22: qty 2

## 2016-09-22 MED ORDER — LORAZEPAM 2 MG PO TABS
0.0000 mg | ORAL_TABLET | Freq: Four times a day (QID) | ORAL | Status: DC
  Administered 2016-09-22: 2 mg via ORAL
  Administered 2016-09-23: 1 mg via ORAL
  Filled 2016-09-22 (×2): qty 1

## 2016-09-22 MED ORDER — SODIUM CHLORIDE 0.9 % IV SOLN
Freq: Once | INTRAVENOUS | Status: AC
  Administered 2016-09-22: 14:00:00 via INTRAVENOUS

## 2016-09-22 MED ORDER — LORAZEPAM 1 MG PO TABS
ORAL_TABLET | ORAL | Status: AC
  Administered 2016-09-22: 1 mg via ORAL
  Filled 2016-09-22: qty 1

## 2016-09-22 NOTE — ED Notes (Signed)
Took out patients IV and removed all Electrodes and leads. [facility]

## 2016-09-22 NOTE — ED Triage Notes (Signed)
Pt states she is wanting alcohol detox , states she has drank half a fifth of vodka today. States she called the ambulance to bring her here today..Marland Kitchen

## 2016-09-22 NOTE — ED Notes (Signed)
BEHAVIORAL HEALTH ROUNDING Patient sleeping: No. Patient alert and oriented: yes Behavior appropriate: Yes.  ; If no, describe:  Nutrition and fluids offered: yes Toileting and hygiene offered: Yes  Sitter present: q15 minute observations and security  monitoring Law enforcement present: Yes  ODS  

## 2016-09-22 NOTE — ED Provider Notes (Signed)
-----------------------------------------   9:25 PM on 09/22/2016 -----------------------------------------  The patient's labs have normalized. She no longer requires transfer to another facility. We will discuss with [facility] for possible detox. Patient agreeable to plan. Patient is medically clear at this time.   Minna AntisKevin Arwin Bisceglia, MD 09/22/16 2125

## 2016-09-22 NOTE — ED Provider Notes (Addendum)
[facility] Emergency Department Provider Note   ____________________________________________   First MD Initiated Contact with Patient 09/22/16 0945     (approximate)  I have reviewed the triage vital signs and the nursing notes.   HISTORY  Chief Complaint Alcohol Problem   HPI Allison Fowler is a 50 y.o. female who wants alcohol detox. She says she drank a fifth of alcohol today. She's been binge drinking for 4 days. Patient denies any other medical problems.   Past Medical History:  Diagnosis Date  . Alcoholism (HCC)   . Anxiety   . Kidney stone     Patient Active Problem List   Diagnosis Date Noted  . Substance induced mood disorder (HCC) 01/04/2016  . Alcohol abuse 01/04/2016  . Alcohol withdrawal (HCC) 01/03/2016    Past Surgical History:  Procedure Laterality Date  . LEG SURGERY    . LITHOTRIPSY      Prior to Admission medications   Medication Sig Start Date End Date Taking? Authorizing Provider  gabapentin (NEURONTIN) 100 MG capsule Take 100 mg by mouth 3 (three) times daily.    Historical Provider, MD  naltrexone (DEPADE) 50 MG tablet Take 1 tablet (50 mg total) by mouth daily. 01/05/16   Audery Amel, MD    Allergies Review of patient's allergies indicates no known allergies.  Family History  Problem Relation Age of Onset  . Cancer Father   . Alcohol abuse Father     Social History Social History  Substance Use Topics  . Smoking status: Current Every Day Smoker    Packs/day: 0.50    Types: Cigarettes  . Smokeless tobacco: Never Used  . Alcohol use 2.4 oz/week    4 Standard drinks or equivalent per week    Review of Systems Constitutional: No fever/chills Eyes: No visual changes. ENT: No sore throat. Cardiovascular: Denies chest pain. Respiratory: Denies shortness of breath. Gastrointestinal: No abdominal pain.  No nausea, no vomiting.  No diarrhea.  No constipation. Genitourinary: Negative for  dysuria. Musculoskeletal: Negative for back pain. Skin: Negative for rash. Neurological: Negative for headaches, focal weakness or numbness.  10-point ROS otherwise negative.  ____________________________________________   PHYSICAL EXAM:  VITAL SIGNS: ED Triage Vitals  Enc Vitals Group     BP 09/22/16 0916 126/80     Pulse Rate 09/22/16 0916 98     Resp 09/22/16 0916 18     Temp 09/22/16 0916 98.5 F (36.9 C)     Temp Source 09/22/16 0916 Oral     SpO2 09/22/16 0916 96 %     Weight 09/22/16 0916 180 lb (81.6 kg)     Height 09/22/16 0916 5\' 5"  (1.651 m)     Head Circumference --      Peak Flow --      Pain Score 09/22/16 0954 7     Pain Loc --      Pain Edu? --      Excl. in GC? --     Constitutional: Alert and oriented. Well appearing and in no acute distress. Eyes: Conjunctivae are normal. PERRL. EOMI. Head: Atraumatic. Nose: No congestion/rhinnorhea. Mouth/Throat: Mucous membranes are moist.  Oropharynx non-erythematous. Neck: No stridor.  Cardiovascular: Normal rate, regular rhythm. Grossly normal heart sounds.  Good peripheral circulation. Respiratory: Normal respiratory effort.  No retractions. Lungs CTAB. Gastrointestinal: Soft and nontender. No distention. No abdominal bruits. No CVA tenderness. Musculoskeletal: No lower extremity tenderness nor edema.  No joint effusions. Neurologic:  Normal speech and language. No  gross focal neurologic deficits are appreciated. No gait instability. Skin:  Skin is warm, dry and intact. No rash noted. .  ____________________________________________   LABS (all labs ordered are listed, but only abnormal results are displayed)  Labs Reviewed  COMPREHENSIVE METABOLIC PANEL - Abnormal; Notable for the following:       Result Value   Chloride 100 (*)    CO2 15 (*)    Glucose, Bld 54 (*)    Creatinine, Ser 1.12 (*)    Calcium 8.6 (*)    Total Protein 8.3 (*)    AST 50 (*)    GFR calc non Af Amer 57 (*)    Anion gap 22  (*)    All other components within normal limits  ETHANOL - Abnormal; Notable for the following:    Alcohol, Ethyl (B) 227 (*)    All other components within normal limits  CBC - Abnormal; Notable for the following:    RBC 5.90 (*)    Hemoglobin 19.6 (*)    HCT 57.3 (*)    All other components within normal limits  URINALYSIS COMPLETEWITH MICROSCOPIC ([facility] ONLY) - Abnormal; Notable for the following:    Color, Urine YELLOW (*)    APPearance CLOUDY (*)    Ketones, ur 2+ (*)    Hgb urine dipstick 2+ (*)    Protein, ur 100 (*)    Bacteria, UA RARE (*)    Squamous Epithelial / LPF 6-30 (*)    All other components within normal limits  BLOOD GAS, VENOUS - Abnormal; Notable for the following:    pCO2, Ven 34 (*)    pO2, Ven 48.0 (*)    Bicarbonate 14.9 (*)    Acid-base deficit 11.3 (*)    All other components within normal limits  OSMOLALITY - Abnormal; Notable for the following:    Osmolality 332 (*)    All other components within normal limits  GLUCOSE, CAPILLARY - Abnormal; Notable for the following:    Glucose-Capillary 220 (*)    All other components within normal limits  URINE DRUG SCREEN, QUALITATIVE ([facility] ONLY)  VOLATILES,BLD-ACETONE,ETHANOL,ISOPROP,METHANOL  ACETAMINOPHEN LEVEL  SALICYLATE LEVEL  LACTIC ACID, PLASMA  LACTIC ACID, PLASMA  POCT PREGNANCY, URINE  CBG MONITORING, ED   ____________________________________________  EKG   ____________________________________________  RADIOLOGY   ____________________________________________   PROCEDURES  Procedure(s) p He except the patient transferred to the ER.erformed: Procedures  Critical Care performed:   ____________________________________________   INITIAL IMPRESSION / ASSESSMENT AND PLAN / ED COURSE  Pertinent labs & imaging results that were available during my care of the patient were reviewed by me and considered in my medical decision making (see chart for details).    Clinical Course    Patient smelled more chemically to me that alcohol. I got the labs as noted above. Discussed them with poison control. Poison control suggests salicylate level lactic acid level toxic alcohol and ethylene glycol level Dr. Francene Boyers fling glycol the closest place to do them rapidly as [facility]. I discussed with our hospitalist the possibility of hitting her upstairs for the time. It would take to get these labs back and hydrating her in the meantime since patient was in the hallway. Our hospitalist R have never used omeprazole which may be required if the toxic alcohol level comes back positive. They requested transfer patient [facility]. I discussed patient with the ER at [facility] Dr. Thyra Breed I [facility]. Because the labs really going to [facility] and there is a high  possibility of there getting lost or misdirected we both felt it would be best [facility] lab work done hydrating her in the meantime.  _Discussed Labs with poison control. Poison control is reassured. They asked to eat the labs in 3 hours that is a lactate that B and the venous blood gas. Patient herself is feeling well has no problems or complaints at all at this time. Dr. pad a housekeeper has been informed and will follow up the lab work. ___________________________________________   FINAL CLINICAL IMPRESSION(S) / ED DIAGNOSES  Final diagnoses:  Acidosis  High anion gap metabolic acidosis      NEW MEDICATIONS STARTED DURING THIS VISIT:  New Prescriptions   No medications on file     Note:  This document was prepared using Dragon voice recognition software and may include unintentional dictation errors.    Arnaldo NatalPaul F Malinda, MD 09/22/16 1432    Arnaldo NatalPaul F Malinda, MD 09/22/16 [phone]

## 2016-09-22 NOTE — ED Notes (Signed)
[facility]

## 2016-09-22 NOTE — ED Notes (Signed)
Pt. To BHU from ED ambulatory without difficulty, to room  BHU 8. Report from April RN. Pt. Is alert and oriented, warm and dry in no distress. Pt. Denies SI, HI, and AVH. Pt. Calm and cooperative. Pt. Made aware of security cameras and Q15 minute rounds. Pt. Encouraged to let Nursing staff know of any concerns or needs.   

## 2016-09-22 NOTE — ED Notes (Signed)
Pt complains of heartburn. Will notify md.

## 2016-09-22 NOTE — ED Notes (Signed)
Report to margaret, rn in bhu. 

## 2016-09-22 NOTE — ED Notes (Signed)
Patient didn't want anything to eat or drink at this time

## 2016-09-22 NOTE — ED Notes (Signed)

## 2016-09-22 NOTE — ED Notes (Signed)
ED BHU PLACEMENT JUSTIFICATION Is the patient under IVC or is there intent for IVC: No. Is the patient medically cleared: Yes.   Is there vacancy in the ED BHU: Yes.   Is the population mix appropriate for patient: Yes.   Is the patient awaiting placement in inpatient or outpatient setting: No. Has the patient had a psychiatric consult: No Survey of unit performed for contraband, proper placement and condition of furniture, tampering with fixtures in bathroom, shower, and each patient room: Yes.  ; Findings: NA APPEARANCE/BEHAVIOR calm, cooperative and adequate rapport can be established NEURO ASSESSMENT Orientation: time, place and person Hallucinations: No.Auditory Hallucinations Speech: Normal Gait: normal RESPIRATORY ASSESSMENT Normal expansion.  Clear to auscultation.  No rales, rhonchi, or wheezing. CARDIOVASCULAR ASSESSMENT regular rate and rhythm, S1, S2 normal, no murmur, click, rub or gallop GASTROINTESTINAL ASSESSMENT soft, nontender, BS WNL, no r/g EXTREMITIES normal strength, tone, and muscle mass PLAN OF CARE Provide calm/safe environment. Vital signs assessed twice daily. ED BHU Assessment once each 12-hour shift. Collaborate with intake RN daily or as condition indicates. Assure the ED provider has rounded once each shift. Provide and encourage hygiene. Provide redirection as needed. Assess for escalating behavior; address immediately and inform ED provider.  Assess family dynamic and appropriateness for visitation as needed: Yes.  ; If necessary, describe findings: NA Educate the patient/family about BHU procedures/visitation: Yes.  ; If necessary, describe findings: NA

## 2016-09-22 NOTE — ED Notes (Signed)
Pt updated on progress of lab work that was obtained at 1950. Pt verbalizes understanding. Pt denies further needs at this time. Pt watching tv in no acute distress.

## 2016-09-22 NOTE — ED Notes (Signed)
Pt updated on plan of care. Pt sitting up in no acute distress, resps unlabored, skin pwd. Pt denies "feeling like i'm going into withdrawal" currently. Pt ambulatory to restroom without difficulty. No tremors noted. No diaphoresis noted. Pt verbalizes understanding of next blood draw.

## 2016-09-23 DIAGNOSIS — F1010 Alcohol abuse, uncomplicated: Secondary | ICD-10-CM

## 2016-09-23 MED ORDER — GABAPENTIN 300 MG PO CAPS: 300.0000 mg | ORAL_CAPSULE | Freq: Three times a day (TID) | ORAL | 1 refills | Status: DC

## 2016-09-23 NOTE — ED Notes (Signed)
Pt. Noted in room. No complaints or concerns voiced. No distress or abnormal behavior noted. Will continue to monitor with security cameras. Q 15 minute rounds continue. 

## 2016-09-23 NOTE — Consult Note (Signed)
Allison Fowler   Reason for Fowler:  Fowler for 50 year old woman with a history of alcohol abuse who presented requesting detox Referring Physician:  Jimmye Norman Patient Identification: Allison Fowler MRN:  [id number] Principal Diagnosis: Alcohol abuse Diagnosis:   Patient Active Problem List   Diagnosis Date Noted  . Substance induced mood disorder ([place]) [F19.94] 01/04/2016  . Alcohol abuse [F10.10] 01/04/2016  . Alcohol withdrawal ([place]) [F10.239] 01/03/2016    Total Time spent with patient: 1 hour  Subjective:   Allison Fowler is a 50 y.o. female patient admitted with "I've been binge drinking".  HPI:  Patient interviewed. Chart reviewed. Labs and vitals reviewed. 50 year old woman with a history of alcohol abuse presented to the emergency room requesting detox. She says she's been on a binge for about 4 days. Drinking between 375 and 750 mL of liquor a day for 4 days. Prior to that she had been sober for about 6 months. She has not been abusing any other drugs. Patient says she is under a lot of stress which is chronic taking care of her grandmother and not having a job. She denies having any suicidal thoughts. Denies any hallucinations. Denies any homicidal ideation. She is not throwing up today. Not having any hallucinations. Not feeling particularly shaky. No evidence of acute dangerousness. Patient does see an outpatient psychiatrist for anxiety but is off of her medicine.  Social history: Lives with her grandmother. Not working right now. Significant financial stress.  Medical history: No significant ongoing medical problems identified.  Substance abuse history: Long history of intermittent binge drinking. Has been to our TS in the past. Has familiarity with 12-step programs and goes to [place]. No history reported of DTs or seizures.  Past Psychiatric History: No prior psychiatric hospitalization. No history of suicide attempts no history of violence. Has been on  gabapentin 300 mg 3 times a day from her outpatient psychiatrist but has been off of it recently. This was for anxiety. Doesn't report any other past medicines.  Risk to Self: Suicidal Ideation: No Suicidal Intent: No Is patient at risk for suicide?: No Suicidal Plan?: No Access to Means: No What has been your use of drugs/alcohol within the last 12 months?: ETOH, severe How many times?: 0 Other Self Harm Risks: none noted Triggers for Past Attempts: Unknown Intentional Self Injurious Behavior: None Risk to Others: Homicidal Ideation: No Thoughts of Harm to Others: No Current Homicidal Intent: No Current Homicidal Plan: No Access to Homicidal Means: No Identified Victim: none History of harm to others?: No Assessment of Violence: None Noted Violent Behavior Description: n/a Does patient have access to weapons?: No Criminal Charges Pending?: No Does patient have a court date: No Prior Inpatient Therapy: Prior Inpatient Therapy: Yes Prior Therapy Dates: 1.5 years ago Prior Therapy Facilty/Provider(s): [facility] Reason for Treatment: ETOH Prior Outpatient Therapy: Prior Outpatient Therapy: Yes Prior Therapy Dates: ended x 3 months ago Prior Therapy Facilty/Provider(s): [facility] intensive outpatient Reason for Treatment: ETOH, S.A. Does patient have an ACCT team?: No Does patient have Intensive In-House Services?  : No Does patient have [facility] services? : No Does patient have P4CC services?: No  Past Medical History:  Past Medical History:  Diagnosis Date  . Alcoholism ([place])   . Anxiety   . Kidney stone     Past Surgical History:  Procedure Laterality Date  . LEG SURGERY    . LITHOTRIPSY     Family History:  Family History  Problem Relation Age of Onset  .  Cancer Father   . Alcohol abuse Father    Family Psychiatric  History: Patient denies any family history of mental health problems Social History:  History  Alcohol Use  . 2.4 oz/week  . 4 Standard drinks or  equivalent per week     History  Drug Use No    Social History   Social History  . Marital status: Single    Spouse name: N/A  . Number of children: N/A  . Years of education: N/A   Social History Main Topics  . Smoking status: Current Every Day Smoker    Packs/day: 0.50    Types: Cigarettes  . Smokeless tobacco: Never Used  . Alcohol use 2.4 oz/week    4 Standard drinks or equivalent per week  . Drug use: No  . Sexual activity: Not Asked   Other Topics Concern  . None   Social History Narrative  . None   Additional Social History:    Allergies:  No Known Allergies  Labs:  Results for orders placed or performed during the hospital encounter of 09/22/16 (from the past 48 hour(s))  Comprehensive metabolic panel     Status: Abnormal   Collection Time: 09/22/16  9:39 AM  Result Value Ref Range   Sodium 137 135 - 145 mmol/L    Comment: LYTES REPEATED. TCH.   Potassium 4.3 3.5 - 5.1 mmol/L    Comment: HEMOLYSIS AT THIS LEVEL MAY AFFECT RESULT   Chloride 100 (L) 101 - 111 mmol/L   CO2 15 (L) 22 - 32 mmol/L   Glucose, Bld 54 (L) 65 - 99 mg/dL   BUN 13 6 - 20 mg/dL   Creatinine, Ser 1.61 (H) 0.44 - 1.00 mg/dL   Calcium 8.6 (L) 8.9 - 10.3 mg/dL   Total Protein 8.3 (H) 6.5 - 8.1 g/dL   Albumin 4.6 3.5 - 5.0 g/dL   AST 50 (H) 15 - 41 U/L    Comment: HEMOLYSIS AT THIS LEVEL MAY AFFECT RESULT   ALT 28 14 - 54 U/L   Alkaline Phosphatase 117 38 - 126 U/L   Total Bilirubin 1.2 0.3 - 1.2 mg/dL    Comment: HEMOLYSIS AT THIS LEVEL MAY AFFECT RESULT   GFR calc non Af Amer 57 (L) >60 mL/min   GFR calc Af Amer >60 >60 mL/min    Comment: (NOTE) The eGFR has been calculated using the CKD EPI equation. This calculation has not been validated in all clinical situations. eGFR's persistently <60 mL/min signify possible Chronic Kidney Disease.    Anion gap 22 (H) 5 - 15  Ethanol     Status: Abnormal   Collection Time: 09/22/16  9:39 AM  Result Value Ref Range   Alcohol, Ethyl  (B) 227 (H) <5 mg/dL    Comment:        LOWEST DETECTABLE LIMIT FOR SERUM ALCOHOL IS 5 mg/dL FOR MEDICAL PURPOSES ONLY   cbc     Status: Abnormal   Collection Time: 09/22/16  9:39 AM  Result Value Ref Range   WBC 10.5 3.6 - 11.0 K/uL   RBC 5.90 (H) 3.80 - 5.20 MIL/uL   Hemoglobin 19.6 (H) 12.0 - 16.0 g/dL   HCT 09.6 (H) 04.5 - 40.9 %   MCV 97.2 80.0 - 100.0 fL   MCH 33.2 26.0 - 34.0 pg   MCHC 34.2 32.0 - 36.0 g/dL   RDW 81.1 91.4 - 78.2 %   Platelets 296 150 - 440 K/uL  Urine Drug Screen, Qualitative  Status: None   Collection Time: 09/22/16 10:47 AM  Result Value Ref Range   Tricyclic, Ur Screen NONE DETECTED NONE DETECTED   Amphetamines, Ur Screen NONE DETECTED NONE DETECTED   MDMA (Ecstasy)Ur Screen NONE DETECTED NONE DETECTED   Cocaine Metabolite,Ur [place] NONE DETECTED NONE DETECTED   Opiate, Ur Screen NONE DETECTED NONE DETECTED   Phencyclidine (PCP) Ur S NONE DETECTED NONE DETECTED   Cannabinoid 50 Ng, Ur [place] NONE DETECTED NONE DETECTED   Barbiturates, Ur Screen NONE DETECTED NONE DETECTED   Benzodiazepine, Ur Scrn NONE DETECTED NONE DETECTED   Methadone Scn, Ur NONE DETECTED NONE DETECTED    Comment: (NOTE) 102  Tricyclics, urine               Cutoff 1000 ng/mL 200  Amphetamines, urine             Cutoff 1000 ng/mL 300  MDMA (Ecstasy), urine           Cutoff 500 ng/mL 400  Cocaine Metabolite, urine       Cutoff 300 ng/mL 500  Opiate, urine                   Cutoff 300 ng/mL 600  Phencyclidine (PCP), urine      Cutoff 25 ng/mL 700  Cannabinoid, urine              Cutoff 50 ng/mL 800  Barbiturates, urine             Cutoff 200 ng/mL 900  Benzodiazepine, urine           Cutoff 200 ng/mL 1000 Methadone, urine                Cutoff 300 ng/mL 1100 1200 The urine drug screen provides only a preliminary, unconfirmed 1300 analytical test result and should not be used for non-medical 1400 purposes. Clinical consideration and professional judgment should 1500 be applied to  any positive drug screen result due to possible 1600 interfering substances. A more specific alternate chemical method 1700 must be used in order to obtain a confirmed analytical result.  1800 Gas chromato graphy / mass spectrometry (GC/MS) is the preferred 1900 confirmatory method.   Urinalysis complete, with microscopic     Status: Abnormal   Collection Time: 09/22/16 10:47 AM  Result Value Ref Range   Color, Urine YELLOW (A) YELLOW   APPearance CLOUDY (A) CLEAR   Glucose, UA NEGATIVE NEGATIVE mg/dL   Bilirubin Urine NEGATIVE NEGATIVE   Ketones, ur 2+ (A) NEGATIVE mg/dL   Specific Gravity, Urine 1.015 1.005 - 1.030   Hgb urine dipstick 2+ (A) NEGATIVE   pH 5.0 5.0 - 8.0   Protein, ur 100 (A) NEGATIVE mg/dL   Nitrite NEGATIVE NEGATIVE   Leukocytes, UA NEGATIVE NEGATIVE   RBC / HPF 0-5 0 - 5 RBC/hpf   WBC, UA 6-30 0 - 5 WBC/hpf   Bacteria, UA RARE (A) NONE SEEN   Squamous Epithelial / LPF 6-30 (A) NONE SEEN   Mucous PRESENT   Pregnancy, urine POC     Status: None   Collection Time: 09/22/16 10:53 AM  Result Value Ref Range   Preg Test, Ur NEGATIVE NEGATIVE    Comment:        THE SENSITIVITY OF THIS METHODOLOGY IS >24 mIU/mL   Blood gas, venous     Status: Abnormal (Preliminary result)   Collection Time: 09/22/16 11:13 AM  Result Value Ref Range   FIO2 PENDING  Delivery systems PENDING    pH, Ven 7.25 7.250 - 7.430   pCO2, Ven 34 (L) 44.0 - 60.0 mmHg   pO2, Ven 48.0 (H) 32.0 - 45.0 mmHg   Bicarbonate 14.9 (L) 20.0 - 28.0 mmol/L   Acid-base deficit 11.3 (H) 0.0 - 2.0 mmol/L   O2 Saturation 75.7 %   Patient temperature 37.0    Collection site VEIN    Sample type VEIN   Osmolality     Status: Abnormal   Collection Time: 09/22/16 12:25 PM  Result Value Ref Range   Osmolality 332 (HH) 275 - 295 mOsm/kg    Comment: CRITICAL RESULT CALLED TO, READ BACK BY AND VERIFIED WITH: AMY TEAGUE ON 09/22/16 AT 1340 BY [facility]   Lactic acid, plasma     Status: Abnormal   Collection  Time: 09/22/16  2:02 PM  Result Value Ref Range   Lactic Acid, Venous 3.4 (HH) 0.5 - 1.9 mmol/L    Comment: CRITICAL RESULT CALLED TO, READ BACK BY AND VERIFIED WITH AMY TEAGUE @ 1504 09/22/16 BY [facility]   Glucose, capillary     Status: Abnormal   Collection Time: 09/22/16  2:06 PM  Result Value Ref Range   Glucose-Capillary 220 (H) 65 - 99 mg/dL  Blood gas, venous     Status: Abnormal (Preliminary result)   Collection Time: 09/22/16  3:42 PM  Result Value Ref Range   FIO2 PENDING    pH, Ven 7.38 7.250 - 7.430   pCO2, Ven 32 (L) 44.0 - 60.0 mmHg   pO2, Ven 52.0 (H) 32.0 - 45.0 mmHg   Bicarbonate 18.9 (L) 20.0 - 28.0 mmol/L   Acid-base deficit 5.2 (H) 0.0 - 2.0 mmol/L   O2 Saturation 85.6 %   Patient temperature 37.0    Collection site VEIN    Sample type VEIN    Mechanical Rate PENDING   CBC     Status: None   Collection Time: 09/22/16  3:55 PM  Result Value Ref Range   WBC 6.9 3.6 - 11.0 K/uL   RBC 4.74 3.80 - 5.20 MIL/uL   Hemoglobin 15.8 12.0 - 16.0 g/dL   HCT 44.5 35.0 - 47.0 %   MCV 93.9 80.0 - 100.0 fL   MCH 33.2 26.0 - 34.0 pg   MCHC 35.4 32.0 - 36.0 g/dL   RDW 12.8 11.5 - 14.5 %   Platelets 232 150 - 440 K/uL  Comprehensive metabolic panel     Status: Abnormal   Collection Time: 09/22/16  3:55 PM  Result Value Ref Range   Sodium 135 135 - 145 mmol/L   Potassium 3.6 3.5 - 5.1 mmol/L   Chloride 105 101 - 111 mmol/L   CO2 21 (L) 22 - 32 mmol/L   Glucose, Bld 217 (H) 65 - 99 mg/dL   BUN 11 6 - 20 mg/dL   Creatinine, Ser 0.85 0.44 - 1.00 mg/dL   Calcium 7.9 (L) 8.9 - 10.3 mg/dL   Total Protein 6.4 (L) 6.5 - 8.1 g/dL   Albumin 3.6 3.5 - 5.0 g/dL   AST 25 15 - 41 U/L   ALT 19 14 - 54 U/L   Alkaline Phosphatase 84 38 - 126 U/L   Total Bilirubin 0.6 0.3 - 1.2 mg/dL   GFR calc non Af Amer >60 >60 mL/min   GFR calc Af Amer >60 >60 mL/min    Comment: (NOTE) The eGFR has been calculated using the CKD EPI equation. This calculation has not been validated in  all clinical  situations. eGFR's persistently <60 mL/min signify possible Chronic Kidney Disease.    Anion gap 9 5 - 15  Ethanol     Status: Abnormal   Collection Time: 09/22/16  3:55 PM  Result Value Ref Range   Alcohol, Ethyl (B) 60 (H) <5 mg/dL    Comment:        LOWEST DETECTABLE LIMIT FOR SERUM ALCOHOL IS 5 mg/dL FOR MEDICAL PURPOSES ONLY   Acetaminophen level     Status: Abnormal   Collection Time: 09/22/16  5:00 PM  Result Value Ref Range   Acetaminophen (Tylenol), Serum <10 (L) 10 - 30 ug/mL    Comment:        THERAPEUTIC CONCENTRATIONS VARY SIGNIFICANTLY. A RANGE OF 10-30 ug/mL MAY BE AN EFFECTIVE CONCENTRATION FOR MANY PATIENTS. HOWEVER, SOME ARE BEST TREATED AT CONCENTRATIONS OUTSIDE THIS RANGE. ACETAMINOPHEN CONCENTRATIONS >150 ug/mL AT 4 HOURS AFTER INGESTION AND >50 ug/mL AT 12 HOURS AFTER INGESTION ARE OFTEN ASSOCIATED WITH TOXIC REACTIONS.   Salicylate level     Status: None   Collection Time: 09/22/16  5:00 PM  Result Value Ref Range   Salicylate Lvl <2.7 2.8 - 30.0 mg/dL  Lactic acid, plasma     Status: Abnormal   Collection Time: 09/22/16  5:00 PM  Result Value Ref Range   Lactic Acid, Venous 2.5 (HH) 0.5 - 1.9 mmol/L    Comment: CRITICAL RESULT CALLED TO, READ BACK BY AND VERIFIED WITH AMY TEAGUE @ 1746 09/22/16 BY [facility]   Osmolality     Status: None   Collection Time: 09/22/16  5:00 PM  Result Value Ref Range   Osmolality 294 275 - 295 mOsm/kg  Basic metabolic panel     Status: Abnormal   Collection Time: 09/22/16  7:49 PM  Result Value Ref Range   Sodium 136 135 - 145 mmol/L   Potassium 3.7 3.5 - 5.1 mmol/L   Chloride 107 101 - 111 mmol/L   CO2 20 (L) 22 - 32 mmol/L   Glucose, Bld 122 (H) 65 - 99 mg/dL   BUN 9 6 - 20 mg/dL   Creatinine, Ser 0.71 0.44 - 1.00 mg/dL   Calcium 8.0 (L) 8.9 - 10.3 mg/dL   GFR calc non Af Amer >60 >60 mL/min   GFR calc Af Amer >60 >60 mL/min    Comment: (NOTE) The eGFR has been calculated using the CKD EPI equation. This  calculation has not been validated in all clinical situations. eGFR's persistently <60 mL/min signify possible Chronic Kidney Disease.    Anion gap 9 5 - 15  Blood gas, venous     Status: Abnormal   Collection Time: 09/22/16  7:49 PM  Result Value Ref Range   pH, Ven 7.40 7.250 - 7.430   pCO2, Ven 38 (L) 44.0 - 60.0 mmHg   pO2, Ven 54.0 (H) 32.0 - 45.0 mmHg   Bicarbonate 23.5 20.0 - 28.0 mmol/L   Acid-base deficit 1.0 0.0 - 2.0 mmol/L   O2 Saturation 87.6 %   Patient temperature 37.0    Collection site VENOUS    Sample type VENOUS   Lactic acid, plasma     Status: None   Collection Time: 09/22/16  7:49 PM  Result Value Ref Range   Lactic Acid, Venous 1.0 0.5 - 1.9 mmol/L  Osmolality     Status: None   Collection Time: 09/22/16  7:49 PM  Result Value Ref Range   Osmolality 284 275 - 295 mOsm/kg  Current Facility-Administered Medications  Medication Dose Route Frequency Provider Last Rate Last Dose  . LORazepam (ATIVAN) tablet 0-4 mg  0-4 mg Oral Q6H Nena Polio, MD   1 mg at 09/23/16 0016   Followed by  . [START ON 09/24/2016] LORazepam (ATIVAN) tablet 0-4 mg  0-4 mg Oral Q12H Nena Polio, MD       Current Outpatient Prescriptions  Medication Sig Dispense Refill  . gabapentin (NEURONTIN) 100 MG capsule Take 100 mg by mouth 3 (three) times daily.    Marland Kitchen gabapentin (NEURONTIN) 300 MG capsule Take 1 capsule (300 mg total) by mouth 3 (three) times daily. 90 capsule 1  . naltrexone (DEPADE) 50 MG tablet Take 1 tablet (50 mg total) by mouth daily. 30 tablet 0    Musculoskeletal: Strength & Muscle Tone: within normal limits Gait & Station: normal Patient leans: N/A  Psychiatric Specialty Exam: Physical Exam  Nursing note and vitals reviewed. Constitutional: She appears well-developed and well-nourished.  HENT:  Head: Normocephalic and atraumatic.  Eyes: Conjunctivae are normal. Pupils are equal, round, and reactive to light.  Neck: Normal range of motion.   Cardiovascular: Regular rhythm and normal heart sounds.   Respiratory: Effort normal. No respiratory distress.  GI: Soft.  Musculoskeletal: Normal range of motion.  Neurological: She is alert.  Skin: Skin is warm and dry.  Psychiatric: Judgment normal. Her mood appears anxious. Her speech is delayed. She is slowed. Thought content is not paranoid. Cognition and memory are normal. She expresses no homicidal and no suicidal ideation.    Review of Systems  Constitutional: Negative.   HENT: Negative.   Eyes: Negative.   Respiratory: Negative.   Cardiovascular: Negative.   Gastrointestinal: Negative.   Musculoskeletal: Negative.   Skin: Negative.   Neurological: Negative.   Psychiatric/Behavioral: Positive for substance abuse. Negative for depression, hallucinations, memory loss and suicidal ideas. The patient is nervous/anxious and has insomnia.     Blood pressure 130/71, pulse 75, temperature 97.8 F (36.6 C), temperature source Oral, resp. rate 16, height '5\' 5"'$  (1.651 m), weight 81.6 kg (180 lb), last menstrual period 09/09/2016, SpO2 98 %.Body mass index is 29.95 kg/m.  General Appearance: Disheveled  Eye Contact:  Fair  Speech:  Normal Rate  Volume:  Decreased  Mood:  Dysphoric  Affect:  Congruent  Thought Process:  Goal Directed  Orientation:  Full (Time, Place, and Person)  Thought Content:  Logical  Suicidal Thoughts:  No  Homicidal Thoughts:  No  Memory:  Immediate;   Good Recent;   Good Remote;   Fair  Judgement:  Fair  Insight:  Good  Psychomotor Activity:  Decreased  Concentration:  Concentration: Fair  Recall:  [employer] of Knowledge:  Fair  Language:  Fair  Akathisia:  No  Handed:  Right  AIMS (if indicated):     Assets:  Communication Skills Desire for Improvement Financial Resources/Insurance Housing Physical Health Resilience Social Support  ADL's:  Intact  Cognition:  WNL  Sleep:        Treatment Plan Summary: Plan 50 year old woman with  alcohol abuse. Currently medically stable. No sign of oncoming delirium. No sign of oncoming seizures. Not hallucinating. Patient does not need hospitalization at our facility. She does not meet commitment criteria. She is requesting release from the hospital. Supportive counseling and reviewed plan with her. She will follow-up with our TS and also with local [facility]. I have agreed to write a prescription for her gabapentin to restart that while  she waits to get in to see her psychiatrist. No other change to treatment plan. Case will be reviewed with emergency room physician and she can be released from the emergency room.  Disposition: No evidence of imminent risk to self or others at present.   Patient does not meet criteria for psychiatric inpatient admission. Supportive therapy provided about ongoing stressors.  Alethia Berthold, MD 09/23/2016 12:03 PM

## 2016-09-23 NOTE — ED Provider Notes (Signed)
-----------------------------------------   7:01 AM on 09/23/2016 -----------------------------------------   Blood pressure 130/71, pulse 75, temperature 97.8 F (36.6 C), temperature source Oral, resp. rate 16, height 5\' 5"  (1.651 m), weight 180 lb (81.6 kg), last menstrual period 09/09/2016, SpO2 98 %.  The patient had no acute events since last update.  Calm and cooperative at this time.  Disposition is pending Psychiatry/Behavioral Medicine team recommendations.     Irean HongJade J Sung, MD 09/23/16 [id number]

## 2016-09-23 NOTE — ED Notes (Signed)
Alona BeneJoyce from poison control called at 2350. Up dated vitals given to [place]. Per Alona BeneJoyce case is being closed.

## 2016-09-23 NOTE — ED Notes (Signed)
Pt would like to be discharged home at this time and denis SI/HI and AVH. Writer reviewed d/c instructions including follow up plan and provided prescription at time of discharge. Pt will be picked up by mother in ED waiting room.

## 2016-09-23 NOTE — BH Assessment (Signed)
Tele Assessment Note   Allison Fowler is an 50 y.o. female, Caucasian, Single who presents to [facility] voluntarily  per ED report: wants alcohol detox. She says she drank a fifth of alcohol today. She's been binge drinking for 4 days. Patient denies any other medical problems. Patient states that she has hx. Of sezures, and that she was seen intensive [facility] Up until x 3 months ago. Patient states primary concern is to seek help for binge drinking. Patient denies hx. Of psychotic symptoms. Patient currently resides with grandmother.  Patient denies current SI or plan and HI. Patient denies AVH, or hx. Of. Patient acknowledges hx. Of S.A. With alcohol beginning at age 50 with 1 pint or 1/5 daily when binge drinking , last consumption was on 09/22/16 for unknown amount. Patient acknowledges history of inpatient with last stated at 1.5 years ago at [facility] for ETOH. Patient acknowledges hx of outpatient with last AT [facility] ended x 3 months ago for intensive outpatient S.A. Treatment.   Patient is dressed in scrubs and is alert and oriented x4. Patient speech was within normal limits and motor behavior appeared normal. Patient thought process is coherent. Patient does not appear to be responding to internal stimuli. Patient was cooperative throughout the assessment and states that  she is agreeable to inpatient psychiatric treatment.    Diagnosis: Alcohol Use Disorder, Severe  Past Medical History:  Past Medical History:  Diagnosis Date  . Alcoholism (HCC)   . Anxiety   . Kidney stone     Past Surgical History:  Procedure Laterality Date  . LEG SURGERY    . LITHOTRIPSY      Family History:  Family History  Problem Relation Age of Onset  . Cancer Father   . Alcohol abuse Father     Social History:  reports that she has been smoking Cigarettes.  She has been smoking about 0.50 packs per day. She has never used smokeless tobacco. She reports that she drinks about 2.4 oz of alcohol  per week . She reports that she does not use drugs.  Additional Social History:  Alcohol / Drug Use Pain Medications: SEE MAR Prescriptions: SEE MAR Over the Counter: SEE MAR History of alcohol / drug use?: Yes Longest period of sobriety (when/how long): 6.5 years x 8-10 years ago Negative Consequences of Use: Financial, Legal, Personal relationships Withdrawal Symptoms: Seizures, Patient aware of relationship between substance abuse and physical/medical complications Onset of Seizures: unspecified Date of most recent seizure: unspecified Substance #1 Name of Substance 1: Alcohol 1 - Age of First Use: 32 1 - Amount (size/oz): 1 pint- 1/5 1 - Frequency: daily when binge drinking 1 - Duration: years 1 - Last Use / Amount: 09/22/16 unknown amount  CIWA: CIWA-Ar BP: 130/71 Pulse Rate: 75 Nausea and Vomiting: no nausea and no vomiting Tactile Disturbances: none Tremor: no tremor Auditory Disturbances: not present Paroxysmal Sweats: no sweat visible Visual Disturbances: not present Anxiety: two Headache, Fullness in Head: none present Agitation: normal activity Orientation and Clouding of Sensorium: oriented and can do serial additions CIWA-Ar Total: 2 COWS:    PATIENT STRENGTHS: (choose at least two) Active sense of humor Average or above average intelligence Communication skills  Allergies: No Known Allergies  Home Medications:  (Not in a hospital admission)  OB/GYN Status:  Patient's last menstrual period was 09/09/2016 (approximate).  General Assessment Data [facility] Assessment: In system Is this a Tele or Face-to-Face Assessment?: Face-to-Face Is  this an Initial Assessment or a Re-assessment for this encounter?: Initial Assessment Marital status: Single Maiden name: n/a Is patient pregnant?: No Pregnancy Status: No Living Arrangements: Other relatives Can pt return to current living arrangement?: Yes Admission Status: Voluntary Is  patient capable of signing voluntary admission?: Yes Referral Source: Self/Family/Friend Insurance type: [facility]     Crisis Care Plan Living Arrangements: Other relatives Name of Psychiatrist: none Name of Therapist: none  Education Status Is patient currently in school?: No Current Grade: n/a Highest grade of school patient has completed: 12th Name of school: unspecified Contact person: none given  Risk to self with the past 6 months Suicidal Ideation: No Has patient been a risk to self within the past 6 months prior to admission? : No Suicidal Intent: No Has patient had any suicidal intent within the past 6 months prior to admission? : No Is patient at risk for suicide?: No Suicidal Plan?: No Has patient had any suicidal plan within the past 6 months prior to admission? : No Access to Means: No What has been your use of drugs/alcohol within the last 12 months?: ETOH, severe Previous Attempts/Gestures: No How many times?: 0 Other Self Harm Risks: none noted Triggers for Past Attempts: Unknown Intentional Self Injurious Behavior: None Family Suicide History: No Recent stressful life event(s): Turmoil (Comment) Persecutory voices/beliefs?: No Depression: Yes Depression Symptoms: Despondent, Insomnia, Tearfulness, Isolating, Fatigue, Guilt, Loss of interest in usual pleasures, Feeling worthless/self pity, Feeling angry/irritable Substance abuse history and/or treatment for substance abuse?: Yes Suicide prevention information given to non-admitted patients: Not applicable  Risk to Others within the past 6 months Homicidal Ideation: No Does patient have any lifetime risk of violence toward others beyond the six months prior to admission? : No Thoughts of Harm to Others: No Current Homicidal Intent: No Current Homicidal Plan: No Access to Homicidal Means: No Identified Victim: none History of harm to others?: No Assessment of Violence: None Noted Violent Behavior  Description: n/a Does patient have access to weapons?: No Criminal Charges Pending?: No Does patient have a court date: No Is patient on probation?: No  Psychosis Hallucinations: None noted Delusions: None noted  Mental Status Report Appearance/Hygiene: In scrubs Eye Contact: Fair Motor Activity: Freedom of movement Speech: Logical/coherent Level of Consciousness: Alert Mood: Depressed, Despair Affect: Depressed Anxiety Level: Panic Attacks Panic attack frequency: weekly Most recent panic attack: not specified Thought Processes: Coherent, Relevant Judgement: Impaired Orientation: Person, Place, Time, Situation, Appropriate for developmental age Obsessive Compulsive Thoughts/Behaviors: None  Cognitive Functioning Concentration: Decreased Memory: Recent Intact, Remote Intact IQ: Average Insight: Fair Impulse Control: Poor Appetite: Fair Weight Loss: 0 Weight Gain: 0 Sleep: Decreased Total Hours of Sleep: 4 Vegetative Symptoms: None  ADLScreening [facility] Assessment Services) Patient's cognitive ability adequate to safely complete daily activities?: Yes Patient able to express need for assistance with ADLs?: Yes Independently performs ADLs?: Yes (appropriate for developmental age)  Prior Inpatient Therapy Prior Inpatient Therapy: Yes Prior Therapy Dates: 1.5 years ago Prior Therapy Facilty/Provider(s): [facility] Reason for Treatment: ETOH  Prior Outpatient Therapy Prior Outpatient Therapy: Yes Prior Therapy Dates: ended x 3 months ago Prior Therapy Facilty/Provider(s): [facility] intensive outpatient Reason for Treatment: ETOH, S.A. Does patient have an ACCT team?: No Does patient have Intensive In-House Services?  : No Does patient have [facility] services? : No Does patient have P4CC services?: No  ADL Screening (condition at time of admission) Patient's cognitive ability adequate to safely complete daily activities?: Yes Patient able to express need for assistance with  ADLs?: Yes Independently performs ADLs?: Yes (appropriate for developmental age)       Abuse/Neglect Assessment (Assessment to be complete while patient is alone) Physical Abuse: Denies Verbal Abuse: Denies Sexual Abuse: Denies Exploitation of patient/patient's resources: Denies Self-Neglect: Denies Values / Beliefs Cultural Requests During Hospitalization: None Spiritual Requests During Hospitalization: None   Advance Directives (For Healthcare) Does patient have an advance directive?: No Would patient like information on creating an advanced directive?: No - patient declined information    Additional Information 1:1 In Past 12 Months?: No CIRT Risk: No Elopement Risk: No Does patient have medical clearance?: Yes     Disposition:  Disposition Initial Assessment Completed for this Encounter: Yes Disposition of Patient: Other dispositions (TBD)  Arien Morine K Zahrah Sutherlin 09/23/2016 11:29 AM

## 2016-09-23 NOTE — ED Notes (Signed)
Pt is awake and alert this AM. Pt [facility]. Writer informed pt that tx plan is still in development. Pt reports anxiety but mild withdrawal symptoms at this time. Writer provided food and drink and 15 minute checks are ongoing for safety.

## 2016-09-23 NOTE — ED Provider Notes (Signed)
Patient well appearing no acute distress. Seen by Dr. Toni Amendlapacs, case discussed with Dr. Toni Amendlapacs. No acute psychiatric needs. Patient was to go home and will pursue [facility] follow-up for substance abuse treatment. Vital signs normal at this time, no evidence of any imminent withdrawal syndrome. Medically and psychiatrically stable for outpatient follow-up   Sharman CheekPhillip Furqan Gosselin, MD 09/23/16 1217

## 2016-09-24 LAB — VOLATILES,BLD-ACETONE,ETHANOL,ISOPROP,METHANOL
ACETONE, BLOOD: NEGATIVE % (ref 0.000–0.010)
Ethanol, blood: 0.026 % — ABNORMAL HIGH (ref 0.000–0.010)
Isopropanol, blood: NEGATIVE % (ref 0.000–0.010)
METHANOL, BLOOD: NEGATIVE % (ref 0.000–0.010)

## 2016-09-24 LAB — BLOOD GAS, VENOUS
ACID-BASE DEFICIT: 11.3 mmol/L — AB (ref 0.0–2.0)
ACID-BASE DEFICIT: 5.2 mmol/L — AB (ref 0.0–2.0)
Bicarbonate: 14.9 mmol/L — ABNORMAL LOW (ref 20.0–28.0)
Bicarbonate: 18.9 mmol/L — ABNORMAL LOW (ref 20.0–28.0)
O2 SAT: 75.7 %
O2 SAT: 85.6 %
PATIENT TEMPERATURE: 37
PCO2 VEN: 34 mmHg — AB (ref 44.0–60.0)
PCO2 VEN: 32 mmHg — AB (ref 44.0–60.0)
PO2 VEN: 48 mmHg — AB (ref 32.0–45.0)
PO2 VEN: 52 mmHg — AB (ref 32.0–45.0)
Patient temperature: 37
pH, Ven: 7.25 (ref 7.250–7.430)
pH, Ven: 7.38 (ref 7.250–7.430)

## 2016-10-18 ENCOUNTER — Encounter: Payer: Self-pay | Admitting: Emergency Medicine

## 2016-10-18 ENCOUNTER — Emergency Department
Admission: EM | Admit: 2016-10-18 | Discharge: 2016-10-18 | Disposition: A | Discharge status: Home / Self Care | Payer: 59 | Attending: Emergency Medicine | Admitting: Emergency Medicine

## 2016-10-18 DIAGNOSIS — F17210 Nicotine dependence, cigarettes, uncomplicated: Secondary | ICD-10-CM | POA: Insufficient documentation

## 2016-10-18 DIAGNOSIS — F1010 Alcohol abuse, uncomplicated: Secondary | ICD-10-CM

## 2016-10-18 DIAGNOSIS — Z79899 Other long term (current) drug therapy: Secondary | ICD-10-CM | POA: Insufficient documentation

## 2016-10-18 DIAGNOSIS — F1019 Alcohol abuse with unspecified alcohol-induced disorder: Secondary | ICD-10-CM | POA: Insufficient documentation

## 2016-10-18 LAB — COMPREHENSIVE METABOLIC PANEL
ALK PHOS: 119 U/L (ref 38–126)
ALT: 31 U/L (ref 14–54)
ANION GAP: 21 — AB (ref 5–15)
AST: 50 U/L — ABNORMAL HIGH (ref 15–41)
Albumin: 4.2 g/dL (ref 3.5–5.0)
BUN: 10 mg/dL (ref 6–20)
CALCIUM: 8.5 mg/dL — AB (ref 8.9–10.3)
CO2: 16 mmol/L — ABNORMAL LOW (ref 22–32)
CREATININE: 0.72 mg/dL (ref 0.44–1.00)
Chloride: 97 mmol/L — ABNORMAL LOW (ref 101–111)
Glucose, Bld: 63 mg/dL — ABNORMAL LOW (ref 65–99)
Potassium: 3.9 mmol/L (ref 3.5–5.1)
Sodium: 134 mmol/L — ABNORMAL LOW (ref 135–145)
TOTAL PROTEIN: 8 g/dL (ref 6.5–8.1)
Total Bilirubin: 0.9 mg/dL (ref 0.3–1.2)

## 2016-10-18 LAB — URINE DRUG SCREEN, QUALITATIVE (ARMC ONLY)
AMPHETAMINES, UR SCREEN: NOT DETECTED
BENZODIAZEPINE, UR SCRN: NOT DETECTED
Barbiturates, Ur Screen: NOT DETECTED
Cannabinoid 50 Ng, Ur ~~LOC~~: NOT DETECTED
Cocaine Metabolite,Ur ~~LOC~~: NOT DETECTED
MDMA (Ecstasy)Ur Screen: NOT DETECTED
METHADONE SCREEN, URINE: NOT DETECTED
Opiate, Ur Screen: NOT DETECTED
PHENCYCLIDINE (PCP) UR S: NOT DETECTED
TRICYCLIC, UR SCREEN: NOT DETECTED

## 2016-10-18 LAB — CBC
HCT: 50.4 % — ABNORMAL HIGH (ref 35.0–47.0)
Hemoglobin: 17.3 g/dL — ABNORMAL HIGH (ref 12.0–16.0)
MCH: 33.3 pg (ref 26.0–34.0)
MCHC: 34.2 g/dL (ref 32.0–36.0)
MCV: 97.2 fL (ref 80.0–100.0)
PLATELETS: 258 10*3/uL (ref 150–440)
RBC: 5.18 MIL/uL (ref 3.80–5.20)
RDW: 13.2 % (ref 11.5–14.5)
WBC: 13.1 10*3/uL — AB (ref 3.6–11.0)

## 2016-10-18 LAB — GLUCOSE, CAPILLARY: GLUCOSE-CAPILLARY: 69 mg/dL (ref 65–99)

## 2016-10-18 LAB — ETHANOL: ALCOHOL ETHYL (B): 183 mg/dL — AB (ref <5)

## 2016-10-18 NOTE — BH Assessment (Signed)
Per request of ED MD (Dr. Derrill KayGoodman), writer spoke with patient about her treatment options to address her alcohol use.   Writer discussed with the patient about the option of [facility].  Patient in the agreement with the plan.  Patient signed the Release of information, in order to send labs and ER Note.  Information sent sent to [facility] via fax and confirmed it was received ([phone]).  Writer received phone call from [facility] ([phone]) stating they are going to accept the patient and pick her up within the next 30 minutes.  Writer informed the patient's nurse Jeannett Senior(Stephen) and ER MD (Dr. Derrill KayGoodman).  Writer also provided the patient with the contact information for the Peer Support Specialist Lorella Nimrod(Harvey B.) to follow up with outpatient treatment  Patient denies SI/HI and AV/H.

## 2016-10-18 NOTE — ED Notes (Signed)
Pt given graham crackers and coke.  Pt requesting medication, MD notified.  No new orders at this time.

## 2016-10-18 NOTE — ED Provider Notes (Signed)
[facility] Emergency Department Provider Note   ____________________________________________   I have reviewed the triage vital signs and the nursing notes.   HISTORY  Chief Complaint Alcohol Problem   History limited by: Not Limited   HPI Allison Fowler is a 50 y.o. female who presents to the emergency department requesting alcohol detox. States she thought she had to come through the emergency department to go to [facility] so she has not contacted [facility] since her last visit to the emergency department last month for alcohol detox. States that she last went to [facility] in May. Does say she has a history of seizures in the past with withdrawal but that hasn't happened in over a year. Does complain of decreased appetite the past two days.   Past Medical History:  Diagnosis Date  . Alcoholism (HCC)   . Anxiety   . Kidney stone     Patient Active Problem List   Diagnosis Date Noted  . Substance induced mood disorder (HCC) 01/04/2016  . Alcohol abuse 01/04/2016  . Alcohol withdrawal (HCC) 01/03/2016    Past Surgical History:  Procedure Laterality Date  . LEG SURGERY    . LITHOTRIPSY      Prior to Admission medications   Medication Sig Start Date End Date Taking? Authorizing Provider  gabapentin (NEURONTIN) 100 MG capsule Take 100 mg by mouth 3 (three) times daily.    Historical Provider, MD  gabapentin (NEURONTIN) 300 MG capsule Take 1 capsule (300 mg total) by mouth 3 (three) times daily. 09/23/16   Audery AmelJohn T Clapacs, MD  naltrexone (DEPADE) 50 MG tablet Take 1 tablet (50 mg total) by mouth daily. 01/05/16   Audery AmelJohn T Clapacs, MD    Allergies Patient has no known allergies.  Family History  Problem Relation Age of Onset  . Cancer Father   . Alcohol abuse Father     Social History Social History  Substance Use Topics  . Smoking status: Current Every Day Smoker    Packs/day: 0.50    Types: Cigarettes  . Smokeless tobacco: Never Used  . Alcohol use 2.4  oz/week    4 Standard drinks or equivalent per week     Comment: last drink 10/18/2016     Review of Systems  Constitutional: Negative for fever. Cardiovascular: Negative for chest pain. Respiratory: Negative for shortness of breath. Gastrointestinal: Negative for abdominal pain, vomiting and diarrhea. Genitourinary: Negative for dysuria. Musculoskeletal: Negative for back pain. Skin: Negative for rash. Neurological: Negative for headaches, focal weakness or numbness.  10-point ROS otherwise negative.  ____________________________________________   PHYSICAL EXAM:  VITAL SIGNS: ED Triage Vitals  Enc Vitals Group     BP 10/18/16 0736 127/80     Pulse Rate 10/18/16 0736 97     Resp 10/18/16 0736 16     Temp 10/18/16 0736 98.2 F (36.8 C)     Temp Source 10/18/16 0736 Oral     SpO2 10/18/16 0736 98 %     Weight 10/18/16 0736 175 lb (79.4 kg)     Height 10/18/16 0736 5\' 5"  (1.651 m)     Head Circumference --      Peak Flow --      Pain Score 10/18/16 0739 0   Constitutional: Alert and oriented. Slightly anxious appearing. Eyes: Conjunctivae are normal. Normal extraocular movements. ENT   Head: Normocephalic and atraumatic.   Nose: No congestion/rhinnorhea.   Mouth/Throat: Mucous membranes are moist.   Neck: No stridor. Hematological/Lymphatic/Immunilogical: No cervical lymphadenopathy. Cardiovascular: Normal  rate, regular rhythm.  No murmurs, rubs, or gallops.  Respiratory: Normal respiratory effort without tachypnea nor retractions. Breath sounds are clear and equal bilaterally. No wheezes/rales/rhonchi. Gastrointestinal: Soft and nontender. No distention.  Genitourinary: Deferred Musculoskeletal: Normal range of motion in all extremities. No lower extremity edema. Neurologic:  Normal speech and language. No gross focal neurologic deficits are appreciated.  Skin:  Skin is warm, dry and intact. No rash noted. Psychiatric: Mood and affect are normal.  Speech and behavior are normal. Patient exhibits appropriate insight and judgment.  ____________________________________________    LABS (pertinent positives/negatives)  Labs Reviewed  COMPREHENSIVE METABOLIC PANEL - Abnormal; Notable for the following:       Result Value   Sodium 134 (*)    Chloride 97 (*)    CO2 16 (*)    Glucose, Bld 63 (*)    Calcium 8.5 (*)    AST 50 (*)    Anion gap 21 (*)    All other components within normal limits  ETHANOL - Abnormal; Notable for the following:    Alcohol, Ethyl (B) 183 (*)    All other components within normal limits  CBC - Abnormal; Notable for the following:    WBC 13.1 (*)    Hemoglobin 17.3 (*)    HCT 50.4 (*)    All other components within normal limits  URINE DRUG SCREEN, QUALITATIVE ([facility] ONLY)  GLUCOSE, CAPILLARY     ____________________________________________   EKG  None  ____________________________________________    RADIOLOGY  None  ____________________________________________   PROCEDURES  Procedures  ____________________________________________   INITIAL IMPRESSION / ASSESSMENT AND PLAN / ED COURSE  Pertinent labs & imaging results that were available during my care of the patient were reviewed by me and considered in my medical decision making (see chart for details).  Patient here requesting detox from alcohol, was seen recently for same. States distant history of seizures during withdrawal but none recently. On exam patient slightly anxious otherwise appropriate. Blood work with elevated alcohol. Will plan on contacting [facility].    ____________________________________________   FINAL CLINICAL IMPRESSION(S) / ED DIAGNOSES  Alcohol abuse  Note: This dictation was prepared with Dragon dictation. Any transcriptional errors that result from this process are unintentional    Phineas SemenGraydon Roxann Vierra, MD 10/18/16 1021

## 2016-10-18 NOTE — Discharge Instructions (Signed)
Please seek medical attention for any high fevers, chest pain, shortness of breath, change in behavior, persistent vomiting, bloody stool or any other new or concerning symptoms.  

## 2016-10-18 NOTE — ED Triage Notes (Addendum)
Pt to ED via EMS from home c/o alcohol detox.  Pt states drinking fifth of vodka a day since Saturday, last drink 2-3am this morning.  States hasn't eaten due to lack of appetite since Sunday.  States blurry vision, nausea, anxious.  Pt presents A&Ox4, speaking in complete and coherent sentences, chest rise even and unlabored.  EMS vitals 118/80, 72 HR, 56 CBG.  Pt denies any SI and HI.

## 2016-10-18 NOTE — ED Notes (Signed)
Pt to nursing desk asking to use phone.  Also requesting medication.  Patient updated that MD has not ordered any medicines.

## 2017-10-10 IMAGING — CR DG CHEST 2V
2 series · 2 of 2 positions shown · non-contrast
Comparison: CT chest 03/23/2013.

CLINICAL DATA: Nonproductive cough, congestion, chest tightness,
fever and fatigue for 5 days. Initial encounter.

EXAM:
CHEST  2 VIEW

[chest pa]
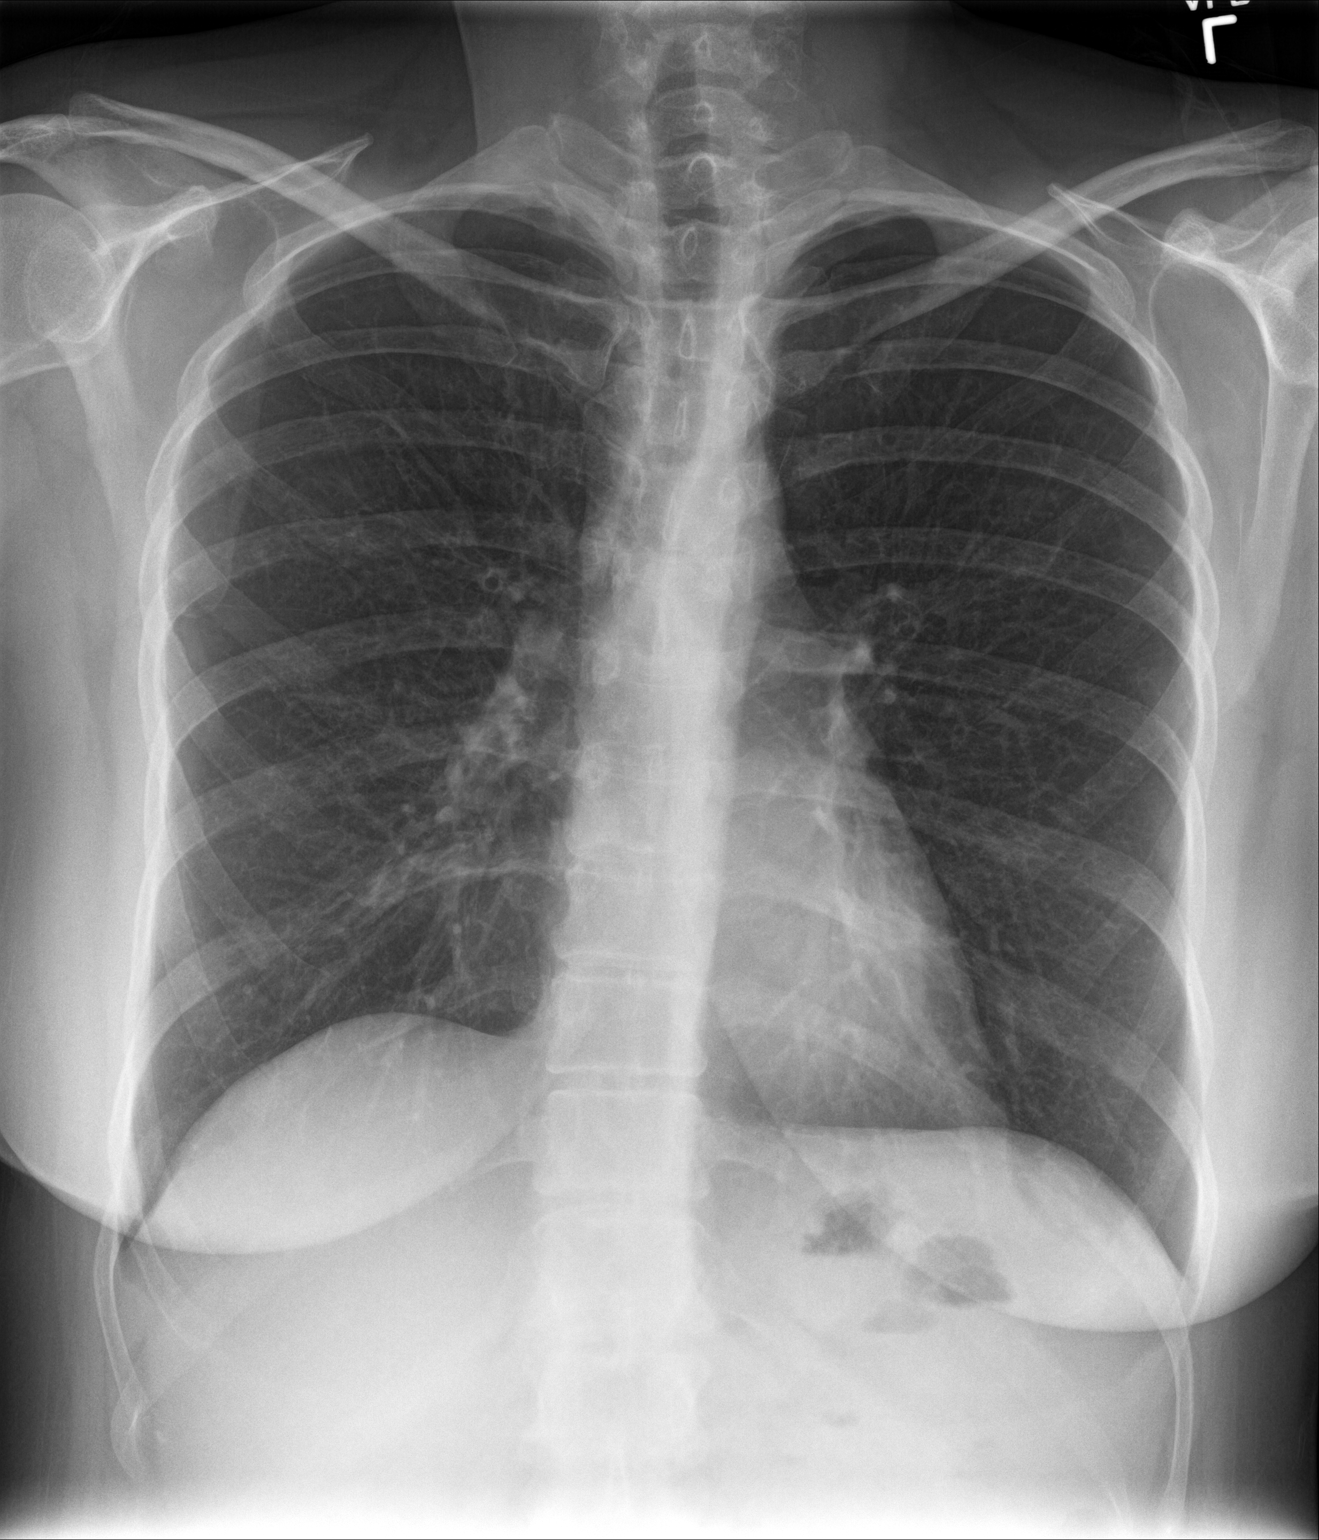

[chest lat]
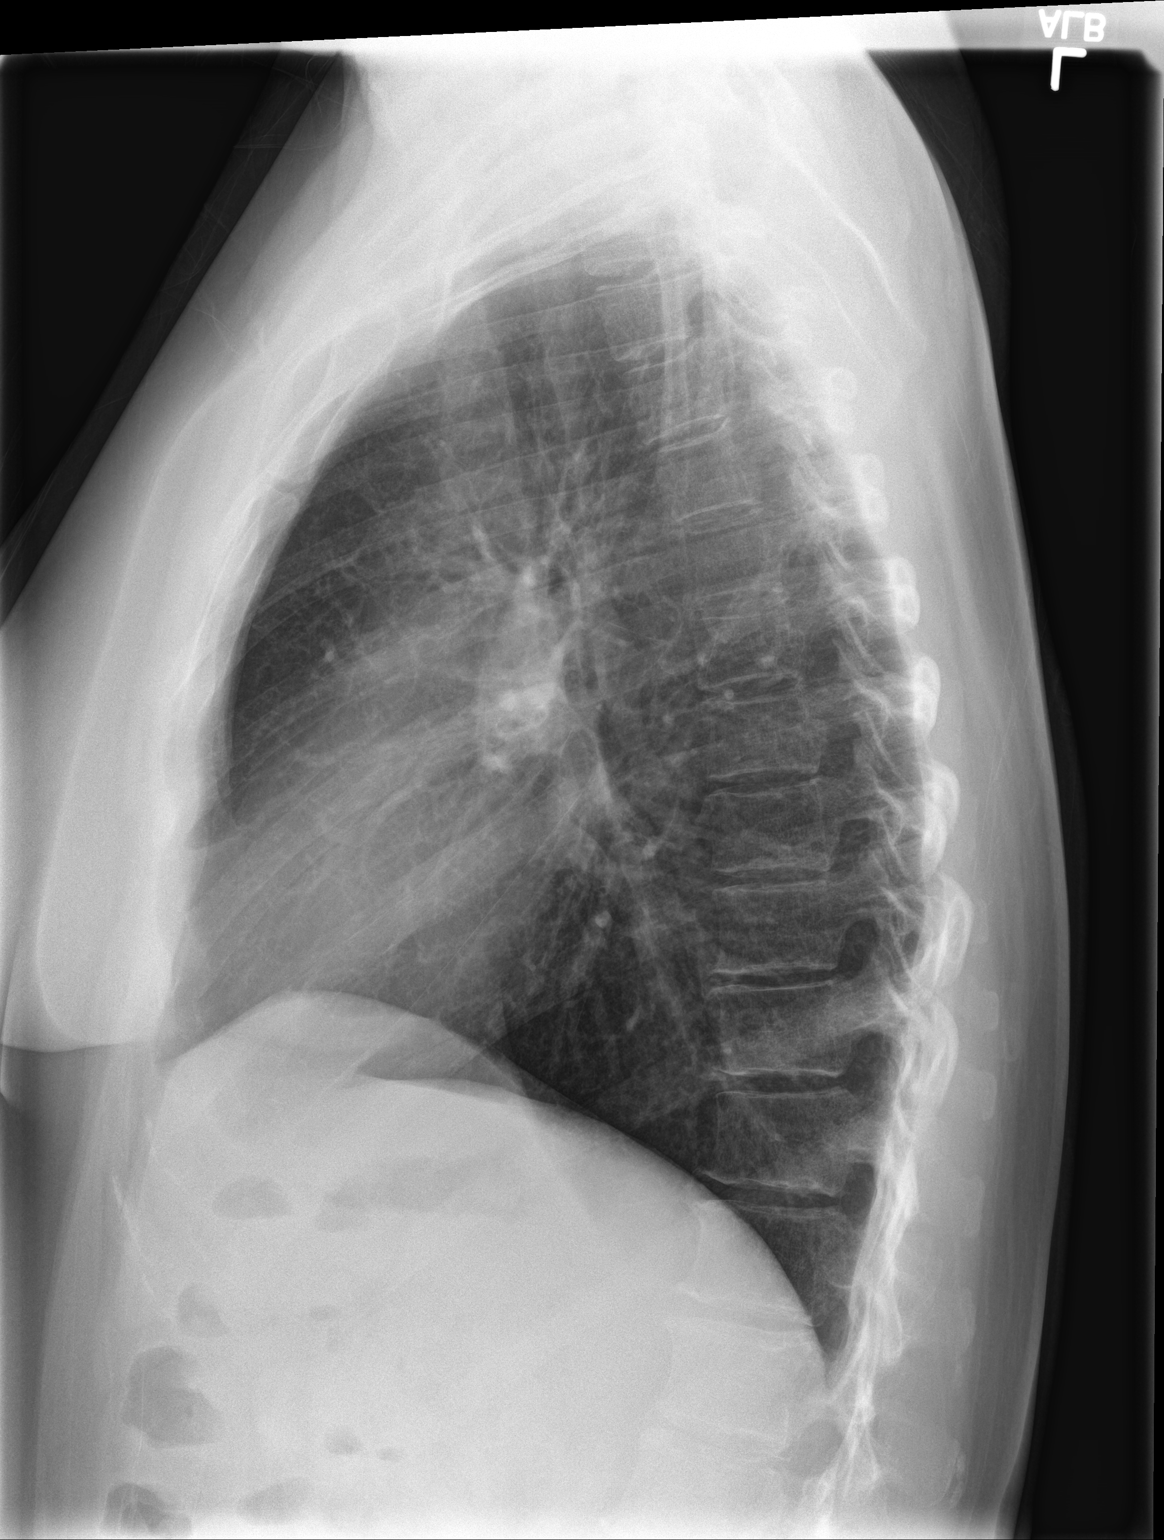

[2 of 2 positions shown; findings below may reference images not displayed]

FINDINGS: The lungs are clear. Heart size is normal. No pneumothorax or
pleural effusion. No focal bony abnormality.
IMPRESSION: Negative chest.

## 2018-11-08 ENCOUNTER — Other Ambulatory Visit: Payer: Self-pay

## 2018-11-08 ENCOUNTER — Encounter: Payer: Self-pay | Admitting: Emergency Medicine

## 2018-11-08 ENCOUNTER — Emergency Department
Admission: EM | Admit: 2018-11-08 | Discharge: 2018-11-08 | Disposition: A | Discharge status: Home / Self Care | Payer: Self-pay | Attending: Student in an Organized Health Care Education/Training Program | Admitting: Student in an Organized Health Care Education/Training Program

## 2018-11-08 DIAGNOSIS — F17210 Nicotine dependence, cigarettes, uncomplicated: Secondary | ICD-10-CM | POA: Insufficient documentation

## 2018-11-08 DIAGNOSIS — Z760 Encounter for issue of repeat prescription: Secondary | ICD-10-CM

## 2018-11-08 DIAGNOSIS — F419 Anxiety disorder, unspecified: Secondary | ICD-10-CM | POA: Insufficient documentation

## 2018-11-08 MED ORDER — GABAPENTIN 400 MG PO CAPS
400.0000 mg | ORAL_CAPSULE | Freq: Four times a day (QID) | ORAL | 0 refills | Status: DC
Start: 2018-11-08 — End: 2018-11-08

## 2018-11-08 MED ORDER — GABAPENTIN 400 MG PO CAPS: 400.0000 mg | ORAL_CAPSULE | Freq: Four times a day (QID) | ORAL | 0 refills | Status: AC

## 2018-11-08 MED ORDER — GABAPENTIN 400 MG PO CAPS
400.0000 mg | ORAL_CAPSULE | Freq: Once | ORAL | Status: AC
  Administered 2018-11-08: 400 mg via ORAL
  Filled 2018-11-08: qty 1
  Filled 2018-11-08: qty 4

## 2018-11-08 MED ORDER — GABAPENTIN 600 MG PO TABS: 300.0000 mg | ORAL_TABLET | Freq: Once | ORAL | Status: DC

## 2018-11-08 NOTE — ED Provider Notes (Signed)
[facility] Emergency Department Provider Note    First MD Initiated Contact with Patient 11/08/18 1506     (approximate)  I have reviewed the triage vital signs and the nursing notes.   HISTORY  Chief Complaint Medication Refill    HPI Allison Fowler is a 52 y.o. female with a history of alcoholism as well as anxiety on 400 mg of gabapentin 4 times daily for the past several months presents the ER for medication refill she is been unable to get back in with her primary psychiatrist, Dr. Elesa Massed.  Patient does arrive with empty bottle showing prescription for this.  No other symptoms or concerns.    Past Medical History:  Diagnosis Date  . Alcoholism (HCC)   . Anxiety   . Kidney stone    Family History  Problem Relation Age of Onset  . Cancer Father   . Alcohol abuse Father    Past Surgical History:  Procedure Laterality Date  . LEG SURGERY    . LITHOTRIPSY     Patient Active Problem List   Diagnosis Date Noted  . Substance induced mood disorder (HCC) 01/04/2016  . Alcohol abuse 01/04/2016  . Alcohol withdrawal (HCC) 01/03/2016      Prior to Admission medications   Medication Sig Start Date End Date Taking? Authorizing Provider  gabapentin (NEURONTIN) 100 MG capsule Take 100 mg by mouth 3 (three) times daily.    [provider]  gabapentin (NEURONTIN) 300 MG capsule Take 1 capsule (300 mg total) by mouth 3 (three) times daily. 09/23/16   Clapacs, Jackquline Denmark, MD  naltrexone (DEPADE) 50 MG tablet Take 1 tablet (50 mg total) by mouth daily. 01/05/16   Clapacs, Jackquline Denmark, MD    Allergies Patient has no known allergies.    Social History Social History   Tobacco Use  . Smoking status: Current Every Day Smoker    Packs/day: 0.50    Types: Cigarettes  . Smokeless tobacco: Never Used  Substance Use Topics  . Alcohol use: Yes    Alcohol/week: 4.0 standard drinks    Types: 4 Standard drinks or equivalent per week    Comment: last  drink 10/18/2016   . Drug use: No    Review of Systems Patient denies headaches, rhinorrhea, blurry vision, numbness, shortness of breath, chest pain, edema, cough, abdominal pain, nausea, vomiting, diarrhea, dysuria, fevers, rashes or hallucinations unless otherwise stated above in HPI. ____________________________________________   PHYSICAL EXAM:  VITAL SIGNS: Vitals:   11/08/18 1437  BP: (!) 134/59  Pulse: 83  Resp: 18  Temp: 98.2 F (36.8 C)  SpO2: 98%    Constitutional: Alert and oriented.  Eyes: Conjunctivae are normal.  Head: Atraumatic. Nose: No congestion/rhinnorhea. Mouth/Throat: Mucous membranes are moist.   Neck: No stridor. Painless ROM.  Cardiovascular: Normal rate, regular rhythm. Grossly normal heart sounds.  Good peripheral circulation. Respiratory: Normal respiratory effort.  No retractions. Lungs CTAB. Gastrointestinal: Soft and nontender. No distention. No abdominal bruits. No CVA tenderness. Genitourinary:  Musculoskeletal: No lower extremity tenderness nor edema.  No joint effusions. Neurologic:  Normal speech and language. No gross focal neurologic deficits are appreciated. No facial droop Skin:  Skin is warm, dry and intact. No rash noted. Psychiatric: Mood and affect are normal. Speech and behavior are normal.  ____________________________________________   LABS (all labs ordered are listed, but only abnormal results are displayed)  No results found for this or any previous visit (from the past 24 hour(s)). ____________________________________________ ____________________________________________  PROCEDURES  Procedure(s) performed:  Procedures    Critical Care performed: no ____________________________________________   INITIAL IMPRESSION / ASSESSMENT AND PLAN / ED COURSE  Pertinent labs & imaging results that were available during my care of the patient were reviewed by me and considered in my medical decision making (see chart  for details).   DDX: Medication refill, withdrawal, drug-seeking behavior  Allison Fowler is a 52 y.o. who presents to the ED with symptoms as described above.  Patient does have empty bottle consistent with prescription for 400 4 times daily.  She is currently in a rehab program and follows with psychiatry but is not able to get into appointment until 1 January.  We will give her prescription for 1 month without refill as she has a known prescription and as she may have difficulty getting into her psychiatrist appointment over the holiday month.      As part of my medical decision making, I reviewed the following data within the electronic MEDICAL RECORD NUMBER Nursing notes reviewed and incorporated, Labs reviewed, notes from prior ED visits.   ____________________________________________   FINAL CLINICAL IMPRESSION(S) / ED DIAGNOSES  Final diagnoses:  Medication refill      NEW MEDICATIONS STARTED DURING THIS VISIT:  New Prescriptions   No medications on file     Note:  This document was prepared using Dragon voice recognition software and may include unintentional dictation errors.    Willy Eddyobinson, Wilma Wuthrich, MD 11/08/18 [phone]

## 2018-11-08 NOTE — Discharge Instructions (Signed)
Follow up with Dr. Elesa MassedWard,  Return for any additional questions or concerns

## 2018-11-08 NOTE — ED Triage Notes (Signed)
PT arrives stating she is feeling very anxious. States she ran out of her gabapentin "a couple of days ago." pt denies Si/HI. Pt states she doesn't want to relapse and drink again due to anxiety.

## 2018-11-30 ENCOUNTER — Encounter: Payer: Self-pay | Admitting: Emergency Medicine

## 2018-11-30 ENCOUNTER — Other Ambulatory Visit: Payer: Self-pay

## 2018-11-30 ENCOUNTER — Emergency Department
Admission: EM | Admit: 2018-11-30 | Discharge: 2018-11-30 | Disposition: A | Discharge status: Home / Self Care | Payer: Self-pay | Attending: Emergency Medicine | Admitting: Emergency Medicine

## 2018-11-30 DIAGNOSIS — Z760 Encounter for issue of repeat prescription: Secondary | ICD-10-CM | POA: Insufficient documentation

## 2018-11-30 DIAGNOSIS — Z79899 Other long term (current) drug therapy: Secondary | ICD-10-CM | POA: Insufficient documentation

## 2018-11-30 DIAGNOSIS — F17210 Nicotine dependence, cigarettes, uncomplicated: Secondary | ICD-10-CM | POA: Insufficient documentation

## 2018-11-30 MED ORDER — GABAPENTIN 400 MG PO CAPS: 400.0000 mg | ORAL_CAPSULE | Freq: Four times a day (QID) | ORAL | 0 refills | Status: AC
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
## 2018-11-30 NOTE — ED Triage Notes (Signed)
PT states she needs a refill on gabapentin. Has been trying to get in to her Dr since last week however, he only works once a week. She received a 7 day supply [facility] last week, needs refill until she can get [facility]. Is not here for any medical complaints.

## 2018-11-30 NOTE — ED Notes (Signed)
See triage note  States she needs gabapentin refill  States she is not able to see her dr

## 2019-02-28 ENCOUNTER — Other Ambulatory Visit: Payer: Self-pay

## 2019-02-28 ENCOUNTER — Encounter: Payer: Self-pay | Admitting: Emergency Medicine

## 2019-02-28 ENCOUNTER — Emergency Department: Payer: Self-pay

## 2019-02-28 ENCOUNTER — Emergency Department
Admission: EM | Admit: 2019-02-28 | Discharge: 2019-02-28 | Disposition: A | Discharge status: Home / Self Care | Payer: Self-pay | Attending: Emergency Medicine | Admitting: Emergency Medicine

## 2019-02-28 DIAGNOSIS — F1010 Alcohol abuse, uncomplicated: Secondary | ICD-10-CM | POA: Insufficient documentation

## 2019-02-28 DIAGNOSIS — F419 Anxiety disorder, unspecified: Secondary | ICD-10-CM | POA: Insufficient documentation

## 2019-02-28 DIAGNOSIS — R059 Cough, unspecified: Secondary | ICD-10-CM

## 2019-02-28 DIAGNOSIS — F17210 Nicotine dependence, cigarettes, uncomplicated: Secondary | ICD-10-CM | POA: Insufficient documentation

## 2019-02-28 DIAGNOSIS — R05 Cough: Secondary | ICD-10-CM | POA: Insufficient documentation

## 2019-02-28 LAB — CBC
HEMATOCRIT: 48.3 % — AB (ref 36.0–46.0)
HEMOGLOBIN: 16.7 g/dL — AB (ref 12.0–15.0)
MCH: 33.1 pg (ref 26.0–34.0)
MCHC: 34.6 g/dL (ref 30.0–36.0)
MCV: 95.6 fL (ref 80.0–100.0)
Platelets: 254 10*3/uL (ref 150–400)
RBC: 5.05 MIL/uL (ref 3.87–5.11)
RDW: 12.8 % (ref 11.5–15.5)
WBC: 8 10*3/uL (ref 4.0–10.5)
nRBC: 0 % (ref 0.0–0.2)

## 2019-02-28 LAB — BASIC METABOLIC PANEL
Anion gap: 14 (ref 5–15)
BUN: 6 mg/dL (ref 6–20)
CHLORIDE: 95 mmol/L — AB (ref 98–111)
CO2: 24 mmol/L (ref 22–32)
CREATININE: 0.75 mg/dL (ref 0.44–1.00)
Calcium: 8.4 mg/dL — ABNORMAL LOW (ref 8.9–10.3)
GFR calc Af Amer: >60 mL/min (ref >60)
GFR calc non Af Amer: >60 mL/min (ref >60)
GLUCOSE: 114 mg/dL — AB (ref 70–99)
POTASSIUM: 3.5 mmol/L (ref 3.5–5.1)
Sodium: 133 mmol/L — ABNORMAL LOW (ref 135–145)

## 2019-02-28 LAB — ETHANOL: ALCOHOL ETHYL (B): 238 mg/dL — AB (ref <10)

## 2019-02-28 LAB — TROPONIN I: Troponin I: <0.03 ng/mL (ref <0.03)

## 2019-02-28 MED ORDER — SODIUM CHLORIDE 0.9% FLUSH: 3.0000 mL | Freq: Once | INTRAVENOUS | Status: DC

## 2019-02-28 MED ORDER — SODIUM CHLORIDE 0.9 % IV BOLUS
500.0000 mL | Freq: Once | INTRAVENOUS | Status: AC
  Administered 2019-02-28: 500 mL via INTRAVENOUS

## 2019-02-28 MED ORDER — ACETAMINOPHEN 325 MG PO TABS
650.0000 mg | ORAL_TABLET | Freq: Once | ORAL | Status: AC
  Administered 2019-02-28: 650 mg via ORAL
  Filled 2019-02-28: qty 2

## 2019-02-28 MED ORDER — THIAMINE HCL 100 MG/ML IJ SOLN
100.0000 mg | Freq: Once | INTRAMUSCULAR | Status: AC
  Administered 2019-02-28: 100 mg via INTRAVENOUS
  Filled 2019-02-28: qty 2

## 2019-02-28 MED ORDER — SODIUM CHLORIDE 0.9 % IV SOLN
1.0000 mg | Freq: Once | INTRAVENOUS | Status: AC
  Administered 2019-02-28: 1 mg via INTRAVENOUS
  Filled 2019-02-28: qty 0.2
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
## 2019-02-28 NOTE — ED Notes (Signed)
TTS at bedside. 

## 2019-02-28 NOTE — ED Notes (Signed)
Patient declined paper that TTS gave her with options for treatment. Patient declines feeling suicidal and homicidal. Dr. Alphonzo Lemmings aware.
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
## 2019-02-28 NOTE — ED Triage Notes (Signed)
FIRST NURSE NOTE-wants detox from alcohol.

## 2019-02-28 NOTE — ED Notes (Signed)
Pt state SOB, cough, congestion, runny nose x 1 week. States has been drinking x 1 week. Has been sober until a week ago. States increased stress with school being out and living in a recovery home with other women. States has been drinking 1/5 a day. Female visitor at bedside. No distress noted. No vomiting, diaphoresis noted. No tremor noted. Mask in place.

## 2019-02-28 NOTE — ED Triage Notes (Signed)
Pt presents via pov from home with sob and productive cough x 1 week. She also requests alcohol detox. Pt has asthma and has been using her inhaler with no relief. Pt has been drinking 1/2 gallon liquor per week plus beer for last 5 days after being sober for 2.5 years. Pt tearful during triage.

## 2019-02-28 NOTE — ED Provider Notes (Addendum)
[facility] Emergency Department Provider Note  ____________________________________________   I have reviewed the triage vital signs and the nursing notes. Where available I have reviewed prior notes and, if possible and indicated, outside hospital notes.    HISTORY  Chief Complaint Shortness of Breath and Alcohol Intoxication    HPI Allison Fowler is a 53 y.o. female  who presents today complaining of multiple different things.  She states mostly she is here because she wants help with alcohol she is been drinking a lot recently.  She also states that she has had a cough recently which is getting better, no known coronavirus exposure no fever, she does have a slight sore throat.  She also states that it hurts to cough in her chest.  Mostly she is here because she wants help with her drinking.   Past Medical History:  Diagnosis Date  . Alcoholism (HCC)   . Anxiety   . Kidney stone     Patient Active Problem List   Diagnosis Date Noted  . Substance induced mood disorder (HCC) 01/04/2016  . Alcohol abuse 01/04/2016  . Alcohol withdrawal (HCC) 01/03/2016    Past Surgical History:  Procedure Laterality Date  . LEG SURGERY    . LITHOTRIPSY      Prior to Admission medications   Medication Sig Start Date End Date Taking? Authorizing Provider  gabapentin (NEURONTIN) 100 MG capsule Take 100 mg by mouth 3 (three) times daily.    [provider]  gabapentin (NEURONTIN) 400 MG capsule Take 1 capsule (400 mg total) by mouth 4 (four) times daily. 11/08/18 12/08/18  Willy Eddy, MD  gabapentin (NEURONTIN) 400 MG capsule Take 1 capsule (400 mg total) by mouth 4 (four) times daily. 11/30/18 11/30/19  Cuthriell, Delorise Royals, PA-C  naltrexone (DEPADE) 50 MG tablet Take 1 tablet (50 mg total) by mouth daily. 01/05/16   Clapacs, Jackquline Denmark, MD    Allergies Patient has no known allergies.  Family History  Problem Relation Age of Onset  . Cancer  Father   . Alcohol abuse Father     Social History Social History   Tobacco Use  . Smoking status: Current Every Day Smoker    Packs/day: 1.00    Types: Cigarettes  . Smokeless tobacco: Never Used  Substance Use Topics  . Alcohol use: Yes    Comment: currently relapsed alcoholic  . Drug use: No    Review of Systems Constitutional: No fever/chills Eyes: No visual changes. ENT: No sore throat. No stiff neck no neck pain Cardiovascular: Denies chest pain she is coughing Respiratory: Denies shortness of breath.  She is coughing which she sometimes does but that is getting better Gastrointestinal:   no vomiting.  No diarrhea.  No constipation. Genitourinary: Negative for dysuria. Musculoskeletal: Negative lower extremity swelling Skin: Negative for rash. Neurological: Negative for severe headaches, focal weakness or numbness.   ____________________________________________   PHYSICAL EXAM:  VITAL SIGNS: ED Triage Vitals  Enc Vitals Group     BP 02/28/19 0930 119/85     Pulse Rate 02/28/19 0930 (!) 108     Resp 02/28/19 0930 18     Temp 02/28/19 0930 98 F (36.7 C)     Temp Source 02/28/19 0930 Oral     SpO2 02/28/19 0930 95 %     Weight 02/28/19 0931 180 lb (81.6 kg)     Height 02/28/19 0931  (1.651 m)     Head Circumference --  Peak Flow --      Pain Score 02/28/19 0930 9     Pain Loc --      Pain Edu? --      Excl. in GC? --     Constitutional: Alert and oriented, smells of alcohol. Well appearing and in no acute distress. Eyes: Conjunctivae are normal Head: Atraumatic HEENT: No congestion/rhinnorhea. Mucous membranes are moist.  Oropharynx non-erythematous Neck:   Nontender with no meningismus, no masses, no stridor Cardiovascular: Normal rate, regular rhythm. Grossly normal heart sounds.  Good peripheral circulation.  Rate is 92 Chest: Some tenderness palpation left chest wall and a chest x-ray patient states "ouch that the pain right there" and  pulls back.  No crepitus no flail chest no evidence of bruising or deformity. Respiratory: Normal respiratory effort.  No retractions. Lungs CTAB. Abdominal: Soft and nontender. No distention. No guarding no rebound Back:  There is no focal tenderness or step off.  there is no midline tenderness there are no lesions noted. there is no CVA tenderness Musculoskeletal: No lower extremity tenderness, no upper extremity tenderness. No joint effusions, no DVT signs strong distal pulses no edema Neurologic:  Normal speech and language. No gross focal neurologic deficits are appreciated.  Skin:  Skin is warm, dry and intact. No rash noted. Psychiatric: Mood and affect are normal. Speech and behavior are normal.  ____________________________________________   LABS (all labs ordered are listed, but only abnormal results are displayed)  Labs Reviewed  BASIC METABOLIC PANEL - Abnormal; Notable for the following components:      Result Value   Sodium 133 (*)    Chloride 95 (*)    Glucose, Bld 114 (*)    Calcium 8.4 (*)    All other components within normal limits  CBC - Abnormal; Notable for the following components:   Hemoglobin 16.7 (*)    HCT 48.3 (*)    All other components within normal limits  ETHANOL - Abnormal; Notable for the following components:   Alcohol, Ethyl (B) 238 (*)    All other components within normal limits  TROPONIN I  URINE DRUG SCREEN, QUALITATIVE ([facility] ONLY)  POC URINE PREG, ED    Pertinent labs  results that were available during my care of the patient were reviewed by me and considered in my medical decision making (see chart for details). ____________________________________________  EKG  I personally interpreted any EKGs ordered by me or triage Sinus tachycardia rate 107 no acute ST elevation or depression normal axis nonspecific ST changes ____________________________________________  RADIOLOGY  Pertinent labs & imaging results that were available during  my care of the patient were reviewed by me and considered in my medical decision making (see chart for details). If possible, patient and/or family made aware of any abnormal findings.  Dg Chest Port 1 View  Result Date: 02/28/2019 CLINICAL DATA:  53 year old female with history of COPD and asthma. Weakness. EXAM: PORTABLE CHEST 1 VIEW COMPARISON:  Chest x-ray 11/21/2015. FINDINGS: Lung volumes are normal. No consolidative airspace disease. No pleural effusions. No pneumothorax. No pulmonary nodule or mass noted. Pulmonary vasculature and the cardiomediastinal silhouette are within normal limits. IMPRESSION: No radiographic evidence of acute cardiopulmonary disease. Electronically Signed   By: Trudie Reed M.D.   On: 02/28/2019 10:10   ____________________________________________    PROCEDURES  Procedure(s) performed: None  Procedures  Critical Care performed: None  ____________________________________________   INITIAL IMPRESSION / ASSESSMENT AND PLAN / ED COURSE  Pertinent labs & imaging results  that were available during my care of the patient were reviewed by me and considered in my medical decision making (see chart for details).  It is clinically able to ambulate talk and eat and drink but her alcohol level is high.  Medically which is not unusual for her.  We will have TTS evaluate her for possible placement, as far as her chest pain is concerned, is very reproducible is been there for a week and she has a negative troponin I do not think this is a PE, no evidence of pneumonia or any other acute intrathoracic pathology.  Does have some mild cold symptoms.  Does not appear to be likely coronavirus no other known exposures, and mostly she is here from desire to be cured of alcoholism or have rehab.  Obviously this is not a rehab facility we will have the counselor talk to her.  Medically speaking there is no other reason to keep her  here.  ----------------------------------------- 12:29 PM on 02/28/2019 ----------------------------------------- Evaluated by TTS, there is no indication at this time for acute inpatient placement and they cannot place her, she is not exhibiting any SI or HI, the rest of her work-up is unremarkable.  At this time, there does not appear to be clinical evidence to support the diagnosis of pulmonary embolus, dissection, myocarditis, endocarditis, pericarditis, pericardial tamponade, acute coronary syndrome, pneumothorax, pneumonia, or any other acute intrathoracic pathology that will require admission or acute intervention. Nor is there evidence of any significant intra-abdominal pathology causing this discomfort.  ----------------------------------------- 1:15 PM on 02/28/2019 -----------------------------------------  Pt eloped     ____________________________________________   FINAL CLINICAL IMPRESSION(S) / ED DIAGNOSES  Final diagnoses:  Cough      This chart was dictated using voice recognition software.  Despite best efforts to proofread,  errors can occur which can change meaning.      Jeanmarie Plant, MD 02/28/19 1117    Jeanmarie Plant, MD 02/28/19 1316
